# Patient Record
Sex: Male | Born: 1965 | Race: White | Hispanic: No | Marital: Married | Smoking: Former smoker
Health system: Southern US, Community
[De-identification: ages and names within clinical notes are randomized; demographics above are authoritative.]

## PROBLEM LIST (undated history)

## (undated) DIAGNOSIS — D126 Benign neoplasm of colon, unspecified: Secondary | ICD-10-CM

## (undated) DIAGNOSIS — R7989 Other specified abnormal findings of blood chemistry: Secondary | ICD-10-CM

## (undated) DIAGNOSIS — G4730 Sleep apnea, unspecified: Secondary | ICD-10-CM

## (undated) DIAGNOSIS — I10 Essential (primary) hypertension: Secondary | ICD-10-CM

## (undated) DIAGNOSIS — E785 Hyperlipidemia, unspecified: Secondary | ICD-10-CM

## (undated) DIAGNOSIS — I739 Peripheral vascular disease, unspecified: Secondary | ICD-10-CM

## (undated) HISTORY — PX: OTHER SURGICAL HISTORY: SHX169

## (undated) HISTORY — PX: APPENDECTOMY: SHX54

## (undated) HISTORY — PX: HERNIA REPAIR: SHX51

---

## 2013-02-10 ENCOUNTER — Ambulatory Visit: Payer: Self-pay | Admitting: Internal Medicine

## 2015-02-22 DIAGNOSIS — E786 Lipoprotein deficiency: Secondary | ICD-10-CM | POA: Insufficient documentation

## 2015-02-22 DIAGNOSIS — E781 Pure hyperglyceridemia: Secondary | ICD-10-CM | POA: Insufficient documentation

## 2015-09-05 DIAGNOSIS — Z720 Tobacco use: Secondary | ICD-10-CM | POA: Insufficient documentation

## 2015-11-30 ENCOUNTER — Encounter: Payer: Self-pay | Admitting: *Deleted

## 2015-12-01 ENCOUNTER — Ambulatory Visit
Admission: RE | Admit: 2015-12-01 | Discharge: 2015-12-01 | Disposition: A | Discharge status: Home / Self Care | Payer: BLUE CROSS/BLUE SHIELD | Source: Ambulatory Visit | Attending: Gastroenterology | Admitting: Gastroenterology

## 2015-12-01 ENCOUNTER — Ambulatory Visit: Payer: BLUE CROSS/BLUE SHIELD | Admitting: Anesthesiology

## 2015-12-01 ENCOUNTER — Encounter: Payer: Self-pay | Admitting: *Deleted

## 2015-12-01 ENCOUNTER — Encounter
Admission: RE | Disposition: A | Discharge status: Home / Self Care | Payer: Self-pay | Source: Ambulatory Visit | Attending: Gastroenterology

## 2015-12-01 DIAGNOSIS — D124 Benign neoplasm of descending colon: Secondary | ICD-10-CM | POA: Insufficient documentation

## 2015-12-01 DIAGNOSIS — Z6833 Body mass index (BMI) 33.0-33.9, adult: Secondary | ICD-10-CM | POA: Insufficient documentation

## 2015-12-01 DIAGNOSIS — E785 Hyperlipidemia, unspecified: Secondary | ICD-10-CM | POA: Diagnosis not present

## 2015-12-01 DIAGNOSIS — E669 Obesity, unspecified: Secondary | ICD-10-CM | POA: Insufficient documentation

## 2015-12-01 DIAGNOSIS — D120 Benign neoplasm of cecum: Secondary | ICD-10-CM | POA: Insufficient documentation

## 2015-12-01 DIAGNOSIS — Z8371 Family history of colonic polyps: Secondary | ICD-10-CM | POA: Diagnosis not present

## 2015-12-01 DIAGNOSIS — Z1211 Encounter for screening for malignant neoplasm of colon: Secondary | ICD-10-CM | POA: Insufficient documentation

## 2015-12-01 DIAGNOSIS — D123 Benign neoplasm of transverse colon: Secondary | ICD-10-CM | POA: Diagnosis not present

## 2015-12-01 DIAGNOSIS — E559 Vitamin D deficiency, unspecified: Secondary | ICD-10-CM | POA: Diagnosis not present

## 2015-12-01 DIAGNOSIS — I10 Essential (primary) hypertension: Secondary | ICD-10-CM | POA: Insufficient documentation

## 2015-12-01 DIAGNOSIS — K5790 Diverticulosis of intestine, part unspecified, without perforation or abscess without bleeding: Secondary | ICD-10-CM

## 2015-12-01 DIAGNOSIS — K5730 Diverticulosis of large intestine without perforation or abscess without bleeding: Secondary | ICD-10-CM | POA: Insufficient documentation

## 2015-12-01 DIAGNOSIS — Z87891 Personal history of nicotine dependence: Secondary | ICD-10-CM | POA: Insufficient documentation

## 2015-12-01 DIAGNOSIS — D122 Benign neoplasm of ascending colon: Secondary | ICD-10-CM | POA: Insufficient documentation

## 2015-12-01 DIAGNOSIS — Z79899 Other long term (current) drug therapy: Secondary | ICD-10-CM | POA: Insufficient documentation

## 2015-12-01 DIAGNOSIS — G4730 Sleep apnea, unspecified: Secondary | ICD-10-CM | POA: Insufficient documentation

## 2015-12-01 DIAGNOSIS — Z7982 Long term (current) use of aspirin: Secondary | ICD-10-CM | POA: Diagnosis not present

## 2015-12-01 HISTORY — DX: Other specified abnormal findings of blood chemistry: R79.89

## 2015-12-01 HISTORY — DX: Sleep apnea, unspecified: G47.30

## 2015-12-01 HISTORY — DX: Hyperlipidemia, unspecified: E78.5

## 2015-12-01 HISTORY — DX: Essential (primary) hypertension: I10

## 2015-12-01 HISTORY — PX: COLONOSCOPY WITH PROPOFOL: SHX5780

## 2015-12-01 HISTORY — DX: Diverticulosis of intestine, part unspecified, without perforation or abscess without bleeding: K57.90

## 2015-12-01 SURGERY — COLONOSCOPY WITH PROPOFOL
Anesthesia: General

## 2015-12-01 MED ORDER — MIDAZOLAM HCL 5 MG/5ML IJ SOLN
INTRAMUSCULAR | Status: DC | PRN
  Administered 2015-12-01: 1 mg via INTRAVENOUS

## 2015-12-01 MED ORDER — PROPOFOL 500 MG/50ML IV EMUL
INTRAVENOUS | Status: DC | PRN
  Administered 2015-12-01: 140 ug/kg/min via INTRAVENOUS

## 2015-12-01 MED ORDER — SODIUM CHLORIDE 0.9 % IV SOLN
INTRAVENOUS | Status: DC
Start: 2015-12-01 — End: 2015-12-01

## 2015-12-01 MED ORDER — SODIUM CHLORIDE 0.9 % IV SOLN
INTRAVENOUS | Status: DC
  Administered 2015-12-01: 1000 mL via INTRAVENOUS

## 2015-12-01 MED ORDER — FENTANYL CITRATE (PF) 100 MCG/2ML IJ SOLN
INTRAMUSCULAR | Status: DC | PRN
  Administered 2015-12-01: 50 ug via INTRAVENOUS

## 2015-12-01 MED ORDER — PROPOFOL 10 MG/ML IV BOLUS
INTRAVENOUS | Status: DC | PRN
  Administered 2015-12-01: 100 mg via INTRAVENOUS

## 2015-12-01 MED ORDER — LIDOCAINE 2% (20 MG/ML) 5 ML SYRINGE
INTRAMUSCULAR | Status: DC | PRN
  Administered 2015-12-01: 40 mg via INTRAVENOUS

## 2015-12-01 NOTE — Anesthesia Postprocedure Evaluation (Signed)
Anesthesia Post Note  Patient: Billy Herman  Procedure(s) Performed: Procedure(s) (LRB): COLONOSCOPY WITH PROPOFOL (N/A)  Patient location during evaluation: PACU Anesthesia Type: General Level of consciousness: awake Pain management: pain level controlled Vital Signs Assessment: post-procedure vital signs reviewed and stable Respiratory status: spontaneous breathing Cardiovascular status: stable Anesthetic complications: no    Last Vitals:  Vitals:   12/01/15 1159 12/01/15 1209  BP: 100/73 110/85  Pulse: 70 70  Resp: 16 11  Temp:      Last Pain:  Vitals:   12/01/15 1139  TempSrc: Tympanic                 VAN STAVEREN,[name]

## 2015-12-01 NOTE — Op Note (Signed)
[facility] Gastroenterology Patient Name: Billy Herman Procedure Date: 12/01/2015 10:38 AM MRN: [id number] Account #: [id number] Date of Birth: [birth year] Admit Type: Outpatient Age: 50 Room: [facility] ENDO ROOM 4 Gender: Male Note Status: Finalized Procedure:            Colonoscopy Indications:          Screening for colorectal malignant neoplasm Providers:            Lollie Sails, MD Referring MD:         [name], MD (Referring MD) Medicines:            Monitored Anesthesia Care Complications:        No immediate complications. Procedure:            Pre-Anesthesia Assessment:                       - ASA Grade Assessment: II - A patient with mild                        systemic disease.                       After obtaining informed consent, the colonoscope was                        passed under direct vision. Throughout the procedure,                        the patient's blood pressure, pulse, and oxygen                        saturations were monitored continuously. The Olympus                        PCF-H180AL colonoscope ( S#: [id number] ) was introduced                        through the anus and advanced to the the cecum,                        identified by appendiceal orifice and ileocecal valve.                        The colonoscopy was performed without difficulty. The                        patient tolerated the procedure well. The quality of                        the bowel preparation was fair. Findings:      Multiple small to medium diverticula were found in the sigmoid colon and       descending colon.      A 4 mm polyp was found in the descending colon. The polyp was flat. The       polyp was removed with a cold snare. Resection and retrieval were       complete.      A 3 mm polyp was found in the cecum. The polyp was flat. The polyp was       removed with a cold snare. Resection and retrieval were  complete.      A 4 mm polyp was  found in the proximal ascending colon. The polyp was       flat. The polyp was removed with a cold snare. Resection and retrieval       were complete.      A 3 mm polyp was found in the distal transverse colon. The polyp was       sessile. The polyp was removed with a cold biopsy forceps. Resection and       retrieval were complete.      The retroflexed view of the distal rectum and anal verge was normal and       showed no anal or rectal abnormalities.      The digital rectal exam was normal. Impression:           - Preparation of the colon was fair.                       - Diverticulosis in the sigmoid colon and in the                        descending colon.                       - One 4 mm polyp in the descending colon, removed with                        a cold snare. Resected and retrieved.                       - One 3 mm polyp in the cecum, removed with a cold                        snare. Resected and retrieved.                       - One 4 mm polyp in the proximal ascending colon,                        removed with a cold snare. Resected and retrieved.                       - One 3 mm polyp in the distal transverse colon,                        removed with a cold biopsy forceps. Resected and                        retrieved.                       - The distal rectum and anal verge are normal on                        retroflexion view. Recommendation:       - Discharge patient to home.                       - Telephone GI clinic for pathology results in 1 week. Procedure Code(s):    --- Professional ---  45385, Colonoscopy, flexible; with removal of tumor(s),                        polyp(s), or other lesion(s) by snare technique                       45380, 59, Colonoscopy, flexible; with biopsy, single                        or multiple Diagnosis Code(s):    --- Professional ---                       Z12.11, Encounter for screening for malignant  neoplasm                        of colon                       D12.4, Benign neoplasm of descending colon                       D12.0, Benign neoplasm of cecum                       D12.2, Benign neoplasm of ascending colon                       D12.3, Benign neoplasm of transverse colon (hepatic                        flexure or splenic flexure)                       K57.30, Diverticulosis of large intestine without                        perforation or abscess without bleeding CPT copyright 2016 American Medical Association. All rights reserved. The codes documented in this report are preliminary and upon coder review may  be revised to meet current compliance requirements. Lollie Sails, MD 12/01/2015 11:35:46 AM This report has been signed electronically. Number of Addenda: 0 Note Initiated On: 12/01/2015 10:38 AM Scope Withdrawal Time: 0 hours 15 minutes 26 seconds  Total Procedure Duration: 0 hours 30 minutes 40 seconds       [facility]

## 2015-12-01 NOTE — H&P (Signed)
Outpatient short stay form Pre-procedure 12/01/2015 10:53 AM Lollie Sails MD  Primary Physician: Dr [name]  Reason for visit:  Screening colonoscopy  History of present illness:  Patient is a 50 year old male presenting today as above. He has a family history of colon polyps in his mother. He did tolerate his prep well. He takes no blood thinning agents. He does take 81 mg aspirin daily which she has held for a couple days.    Current Facility-Administered Medications:  .  0.9 %  sodium chloride infusion, , Intravenous, Continuous, Lollie Sails, MD, Last Rate: 20 mL/hr at 12/01/15 0901, 1,000 mL at 12/01/15 0901 .  0.9 %  sodium chloride infusion, , Intravenous, Continuous, Lollie Sails, MD  Facility-Administered Medications Ordered in Other Encounters:  .  fentaNYL (SUBLIMAZE) injection, , Intravenous, Anesthesia Intra-op, Courtney Paris, CRNA, 50 mcg at 12/01/15 1044 .  midazolam (VERSED) 5 MG/5ML injection, , Intravenous, Anesthesia Intra-op, Courtney Paris, CRNA, 1 mg at 12/01/15 1044  Prescriptions Prior to Admission  Medication Sig Dispense Refill Last Dose  . aspirin EC 81 MG tablet Take 81 mg by mouth daily.     . ergocalciferol (VITAMIN D2) 50000 units capsule Take 50,000 Units by mouth once a week.     Marland Kitchen KRILL OIL PO Take by mouth.     . losartan-hydrochlorothiazide (HYZAAR) 50-12.5 MG tablet Take 1 tablet by mouth daily.   11/30/2015 at 2200     No Known Allergies   Past Medical History:  Diagnosis Date  . Hyperlipidemia   . Hypertension   . Low serum vitamin D   . Sleep apnea     Review of systems:      Physical Exam    Heart and lungs: Regular rate and rhythm without rub or gallop, lungs are bilaterally clear.    HEENT: Normocephalic atraumatic eyes are anicteric    Other:     Pertinant exam for procedure: Soft nontender nondistended bowel sounds positive normoactive.    Planned proceedures: Colonoscopy and indicated  procedures. I have discussed the risks benefits and complications of procedures to include not limited to bleeding, infection, perforation and the risk of sedation and the patient wishes to proceed.    Lollie Sails, MD Gastroenterology 12/01/2015  10:53 AM

## 2015-12-01 NOTE — Transfer of Care (Signed)
Immediate Anesthesia Transfer of Care Note  Patient: Billy Herman  Procedure(s) Performed: Procedure(s): COLONOSCOPY WITH PROPOFOL (N/A)  Patient Location: PACU  Anesthesia Type:General  Level of Consciousness: sedated  Airway & Oxygen Therapy: Patient Spontanous Breathing and Patient connected to nasal cannula oxygen  Post-op Assessment: Report given to RN and Post -op Vital signs reviewed and stable  Post vital signs: Reviewed and stable  Last Vitals:  Vitals:   12/01/15 0845 12/01/15 1139  BP: 139/80 98/68  Pulse: 74 63  Resp: 18 16  Temp: 36.2 C (!) 35.1 C    Last Pain:  Vitals:   12/01/15 1139  TempSrc: Tympanic         Complications: No apparent anesthesia complications

## 2015-12-01 NOTE — Anesthesia Preprocedure Evaluation (Signed)
Anesthesia Evaluation  Patient identified by MRN, date of birth, ID band Patient awake    Reviewed: Allergy & Precautions, NPO status , Patient's Chart, lab work & pertinent test results  Airway Mallampati: II       Dental  (+) Teeth Intact   Pulmonary sleep apnea and Continuous Positive Airway Pressure Ventilation , former smoker,     + decreased breath sounds      Cardiovascular Exercise Tolerance: Good hypertension, Pt. on medications  Rhythm:Regular     Neuro/Psych negative neurological ROS     GI/Hepatic negative GI ROS, Neg liver ROS,   Endo/Other  negative endocrine ROS  Renal/GU negative Renal ROS     Musculoskeletal negative musculoskeletal ROS (+)   Abdominal (+) + obese,   Peds  Hematology negative hematology ROS (+)   Anesthesia Other Findings   Reproductive/Obstetrics                             Anesthesia Physical Anesthesia Plan  ASA: II  Anesthesia Plan: General   Post-op Pain Management:    Induction: Intravenous  Airway Management Planned: Natural Airway and Nasal Cannula  Additional Equipment:   Intra-op Plan:   Post-operative Plan:   Informed Consent: I have reviewed the patients History and Physical, chart, labs and discussed the procedure including the risks, benefits and alternatives for the proposed anesthesia with the patient or authorized representative who has indicated his/her understanding and acceptance.     Plan Discussed with: CRNA  Anesthesia Plan Comments:         Anesthesia Quick Evaluation

## 2015-12-02 ENCOUNTER — Encounter: Payer: Self-pay | Admitting: Gastroenterology

## 2015-12-02 LAB — SURGICAL PATHOLOGY

## 2016-07-26 ENCOUNTER — Ambulatory Visit: Payer: Self-pay | Admitting: Medical

## 2016-07-26 ENCOUNTER — Encounter: Payer: Self-pay | Admitting: Medical

## 2016-07-26 VITALS — BP 120/80 | HR 82 | Temp 98.1°F | Resp 16 | Ht 73.0 in | Wt 263.0 lb

## 2016-07-26 DIAGNOSIS — R7989 Other specified abnormal findings of blood chemistry: Secondary | ICD-10-CM | POA: Insufficient documentation

## 2016-07-26 DIAGNOSIS — I10 Essential (primary) hypertension: Secondary | ICD-10-CM | POA: Insufficient documentation

## 2016-07-26 DIAGNOSIS — R05 Cough: Secondary | ICD-10-CM

## 2016-07-26 DIAGNOSIS — J019 Acute sinusitis, unspecified: Secondary | ICD-10-CM

## 2016-07-26 DIAGNOSIS — R059 Cough, unspecified: Secondary | ICD-10-CM

## 2016-07-26 DIAGNOSIS — B9789 Other viral agents as the cause of diseases classified elsewhere: Secondary | ICD-10-CM

## 2016-07-26 MED ORDER — BENZONATATE 100 MG PO CAPS: 200.0000 mg | ORAL_CAPSULE | Freq: Three times a day (TID) | ORAL | 0 refills | Status: DC | PRN

## 2016-07-26 NOTE — Progress Notes (Signed)
Patient reports sinus headache and pressure for 5 days.  Phlegm and mucus are clear.  Chronic cough is causing abdominal pain.  He's taking OTC Delsym, mucinex and Motrin sinus.  Subjective:    Patient ID: Billy Herman, male    DOB: 03/09/1966, 51 y.o.   MRN: [id number]  HPI 51 yo male with  Started Saturday , sinus drainage, scratchy throat, nasal congestion and runny nose.  No fever.  Headache top of head, after coughing a lot.  Soreness on the left mid abdomen from coughing so much per patient. Cough nonproductive and clear nasal discharge. History of scar at right side of mouth ran into a barb wire fence at  51 years old.   Review of Systems  Constitutional: Negative for chills, fatigue and fever.  HENT: Positive for congestion. Negative for ear pain, sinus pain, sinus pressure, sore throat and tinnitus.   Eyes: Negative for discharge and itching.  Respiratory: Positive for cough. Negative for shortness of breath and wheezing.   Cardiovascular: Negative for chest pain.  Gastrointestinal: Negative for diarrhea, nausea and vomiting.  Endocrine: Negative for polydipsia, polyphagia and polyuria.  Genitourinary: Negative for dysuria and hematuria.  Musculoskeletal: Negative for back pain.  Allergic/Immunologic: Positive for environmental allergies. Negative for food allergies.  Neurological: Negative for dizziness and syncope.  Hematological: Negative for adenopathy.  Psychiatric/Behavioral: Negative for confusion and hallucinations.       Objective:   Physical Exam  Constitutional: He is oriented to person, place, and time. He appears well-developed and well-nourished.  HENT:  Head: Normocephalic and atraumatic.  Right Ear: Hearing, tympanic membrane, external ear and ear canal normal.  Left Ear: Hearing, tympanic membrane, external ear and ear canal normal.  Nose: Rhinorrhea present.  Mouth/Throat: Uvula is midline, oropharynx is clear and moist and mucous membranes are normal.   Eyes: Conjunctivae and EOM are normal. Pupils are equal, round, and reactive to light.  Neck: Normal range of motion. Neck supple.  Cardiovascular: Normal rate and regular rhythm.  Exam reveals no gallop.   No murmur heard. Pulmonary/Chest: Effort normal and breath sounds normal.  Musculoskeletal: Normal range of motion.  Lymphadenopathy:    He has no cervical adenopathy.  Neurological: He is alert and oriented to person, place, and time.  Skin: Skin is warm and dry.  Psychiatric: He has a normal mood and affect. His behavior is normal.  Nursing note and vitals reviewed.         Assessment & Plan:  Benzonatate Perles 200mg  one three times a day for cough  #30 no refills  Otc Zyrtec 10 mg take as directed. OTC Flonase NS take as directed. OTC Motrin or Tylenol if not feeling well.  contact me if  Signs or symptoms of  Bacterial infection occur , Return to the clinic in 3-5 days if not improving. Reviewed with the patient . No questions at discharge, verbalizes understanding .

## 2016-07-26 NOTE — Patient Instructions (Signed)
Rest , increase fluids. Contact me if any chanages. Return to the clinic in  3-5 days if worsening or any concerns.

## 2016-08-31 ENCOUNTER — Other Ambulatory Visit: Payer: Self-pay

## 2016-08-31 DIAGNOSIS — E781 Pure hyperglyceridemia: Secondary | ICD-10-CM

## 2016-08-31 DIAGNOSIS — R7989 Other specified abnormal findings of blood chemistry: Secondary | ICD-10-CM

## 2016-08-31 DIAGNOSIS — Z720 Tobacco use: Secondary | ICD-10-CM

## 2016-08-31 DIAGNOSIS — I10 Essential (primary) hypertension: Secondary | ICD-10-CM

## 2016-09-01 LAB — CMP12+LP+TP+TSH+6AC+PSA+CBC…
A/G RATIO: 1.6 (ref 1.2–2.2)
ALK PHOS: 94 IU/L (ref 39–117)
ALT: 62 IU/L — ABNORMAL HIGH (ref 0–44)
AST: 45 IU/L — AB (ref 0–40)
Albumin: 4.6 g/dL (ref 3.5–5.5)
BASOS ABS: 0 10*3/uL (ref 0.0–0.2)
BILIRUBIN TOTAL: 0.4 mg/dL (ref 0.0–1.2)
BUN / CREAT RATIO: 16 (ref 9–20)
BUN: 15 mg/dL (ref 6–24)
Basos: 0 %
CHLORIDE: 101 mmol/L (ref 96–106)
CREATININE: 0.94 mg/dL (ref 0.76–1.27)
Calcium: 9.9 mg/dL (ref 8.7–10.2)
Chol/HDL Ratio: 5.8 ratio — ABNORMAL HIGH (ref 0.0–5.0)
Cholesterol, Total: 198 mg/dL (ref 100–199)
EOS (ABSOLUTE): 0.2 10*3/uL (ref 0.0–0.4)
EOS: 2 %
ESTIMATED CHD RISK: 1.2 times avg. — AB (ref 0.0–1.0)
Free Thyroxine Index: 1.5 (ref 1.2–4.9)
GFR, EST AFRICAN AMERICAN: 107 mL/min/{1.73_m2} (ref >59)
GFR, EST NON AFRICAN AMERICAN: 93 mL/min/{1.73_m2} (ref >59)
GGT: 81 IU/L — ABNORMAL HIGH (ref 0–65)
Globulin, Total: 2.8 g/dL (ref 1.5–4.5)
Glucose: 108 mg/dL — ABNORMAL HIGH (ref 65–99)
HDL: 34 mg/dL — AB (ref >39)
HEMOGLOBIN: 14.5 g/dL (ref 13.0–17.7)
Hematocrit: 42.4 % (ref 37.5–51.0)
IMMATURE GRANS (ABS): 0 10*3/uL (ref 0.0–0.1)
IRON: 64 ug/dL (ref 38–169)
Immature Granulocytes: 1 %
LDH: 189 IU/L (ref 121–224)
LDL CALC: 85 mg/dL (ref 0–99)
LYMPHS: 25 %
Lymphocytes Absolute: 1.8 10*3/uL (ref 0.7–3.1)
MCH: 31.7 pg (ref 26.6–33.0)
MCHC: 34.2 g/dL (ref 31.5–35.7)
MCV: 93 fL (ref 79–97)
MONOCYTES: 7 %
Monocytes Absolute: 0.5 10*3/uL (ref 0.1–0.9)
Neutrophils Absolute: 4.7 10*3/uL (ref 1.4–7.0)
Neutrophils: 65 %
PLATELETS: 311 10*3/uL (ref 150–379)
Phosphorus: 4.1 mg/dL (ref 2.5–4.5)
Potassium: 4.1 mmol/L (ref 3.5–5.2)
Prostate Specific Ag, Serum: 1.4 ng/mL (ref 0.0–4.0)
RBC: 4.58 x10E6/uL (ref 4.14–5.80)
RDW: 13.7 % (ref 12.3–15.4)
Sodium: 141 mmol/L (ref 134–144)
T3 Uptake Ratio: 21 % — ABNORMAL LOW (ref 24–39)
T4, Total: 7.2 ug/dL (ref 4.5–12.0)
TOTAL PROTEIN: 7.4 g/dL (ref 6.0–8.5)
TSH: 3.41 u[IU]/mL (ref 0.450–4.500)
Triglycerides: 395 mg/dL — ABNORMAL HIGH (ref 0–149)
URIC ACID: 6.1 mg/dL (ref 3.7–8.6)
VLDL CHOLESTEROL CAL: 79 mg/dL — AB (ref 5–40)
WBC: 7.2 10*3/uL (ref 3.4–10.8)

## 2016-09-01 LAB — URINALYSIS, ROUTINE W REFLEX MICROSCOPIC
Bilirubin, UA: NEGATIVE
Glucose, UA: NEGATIVE
Ketones, UA: NEGATIVE
Leukocytes, UA: NEGATIVE
NITRITE UA: NEGATIVE
PH UA: 5.5 (ref 5.0–7.5)
Protein, UA: NEGATIVE
RBC, UA: NEGATIVE
Specific Gravity, UA: 1.019 (ref 1.005–1.030)
Urobilinogen, Ur: 0.2 mg/dL (ref 0.2–1.0)

## 2016-09-01 LAB — MAGNESIUM: MAGNESIUM: 2.2 mg/dL (ref 1.6–2.3)

## 2016-09-01 LAB — B12 AND FOLATE PANEL
Folate: 8.2 ng/mL (ref >3.0)
Vitamin B-12: 1150 pg/mL (ref 232–1245)

## 2016-09-01 LAB — VITAMIN D 25 HYDROXY (VIT D DEFICIENCY, FRACTURES): VIT D 25 HYDROXY: 35.3 ng/mL (ref 30.0–100.0)

## 2016-09-01 LAB — HEMOGLOBIN A1C
ESTIMATED AVERAGE GLUCOSE: 126 mg/dL
HEMOGLOBIN A1C: 6 % — AB (ref 4.8–5.6)

## 2016-09-19 DIAGNOSIS — G4733 Obstructive sleep apnea (adult) (pediatric): Secondary | ICD-10-CM | POA: Insufficient documentation

## 2016-09-19 DIAGNOSIS — R0602 Shortness of breath: Secondary | ICD-10-CM | POA: Insufficient documentation

## 2016-10-22 DIAGNOSIS — I77810 Thoracic aortic ectasia: Secondary | ICD-10-CM | POA: Insufficient documentation

## 2016-10-22 DIAGNOSIS — I351 Nonrheumatic aortic (valve) insufficiency: Secondary | ICD-10-CM | POA: Insufficient documentation

## 2017-01-17 ENCOUNTER — Ambulatory Visit: Payer: Self-pay | Admitting: Adult Health

## 2017-01-17 ENCOUNTER — Encounter: Payer: Self-pay | Admitting: Adult Health

## 2017-01-17 VITALS — BP 120/80 | HR 78 | Temp 97.5°F | Resp 16 | Wt 262.0 lb

## 2017-01-17 DIAGNOSIS — H6502 Acute serous otitis media, left ear: Secondary | ICD-10-CM

## 2017-01-17 MED ORDER — AMOXICILLIN-POT CLAVULANATE 875-125 MG PO TABS: 1.0000 | ORAL_TABLET | Freq: Two times a day (BID) | ORAL | 0 refills | Status: DC

## 2017-01-17 NOTE — Progress Notes (Addendum)
Subjective:     Patient ID: Billy Herman, male   DOB: Dec 10, 1965, 51 y.o.   MRN: [id number]  HPI  Patient is a 51 year old male in no acute distress who presents to the clinic with nasal congestion, chest congestion. Non productive- reports cough is mild. He has been taking Delsym at night.  Reports pressure in his sinuses worse with bending over. Rhinorrhea present.  He denies any recent infections or recent antibiotics.   He denies any pain.     [name], MD is his PCP and he report she sees him regularly.    He wears a CPAP at night.  Denies any recent infections or recent antibiotics.  Patient  denies any fever, chills, rash, chest pain, shortness of breath, nausea, vomiting, or diarrhea.Denies any edema.   He uses smokeless tobacco.  No Known Allergies Patient Active Problem List   Diagnosis Date Noted  . Ascending aorta dilation (HCC) 10/22/2016  . Moderate aortic valve insufficiency 10/22/2016  . OSA (obstructive sleep apnea) 09/19/2016  . SOBOE (shortness of breath on exertion) 09/19/2016  . Hypertension 07/26/2016  . Low serum vitamin D 07/26/2016  . Tobacco user 09/05/2015  . Hypertriglyceridemia 02/22/2015  . Low HDL (under 40) 02/22/2015     Current Outpatient Medications:  .  aspirin EC 81 MG tablet, Take 81 mg by mouth daily., Disp: , Rfl:  .  ergocalciferol (VITAMIN D2) 50000 units capsule, Take 50,000 Units by mouth once a week., Disp: , Rfl:  .  losartan-hydrochlorothiazide (HYZAAR) 50-12.5 MG tablet, Take 1 tablet by mouth daily., Disp: , Rfl:     Review of Systems  Constitutional: Negative for activity change, appetite change, chills, diaphoresis, fatigue, fever and unexpected weight change.  HENT: Positive for congestion, ear pain, postnasal drip and rhinorrhea. Negative for dental problem, drooling, ear discharge, facial swelling, hearing loss, mouth sores, nosebleeds, sinus pressure, sinus pain, sneezing, sore throat, tinnitus and trouble  swallowing.   Respiratory: Negative.  Negative for apnea, cough, choking, chest tightness, shortness of breath, wheezing and stridor.   Cardiovascular: Negative for chest pain, palpitations and leg swelling.  Gastrointestinal: Negative.   Genitourinary: Negative.   Musculoskeletal: Negative.   Skin: Negative.   Allergic/Immunologic: Negative.   Neurological: Negative.   Hematological: Negative.   Psychiatric/Behavioral: Negative.        Objective:   Physical Exam  Constitutional: He is oriented to person, place, and time. Vital signs are normal. He appears well-developed and well-nourished. He is active.  Non-toxic appearance. He does not have a sickly appearance. He does not appear ill. No distress.  HENT:  Head: Normocephalic and atraumatic.  Right Ear: Hearing, tympanic membrane, external ear and ear canal normal.  Left Ear: No drainage or swelling. No mastoid tenderness. Tympanic membrane is erythematous. Tympanic membrane is not perforated.  Nose: Mucosal edema and rhinorrhea present. Right sinus exhibits maxillary sinus tenderness. Right sinus exhibits no frontal sinus tenderness. Left sinus exhibits maxillary sinus tenderness. Left sinus exhibits no frontal sinus tenderness.  Mouth/Throat: Uvula is midline, oropharynx is clear and moist and mucous membranes are normal.  Eyes: Conjunctivae and EOM are normal. Pupils are equal, round, and reactive to light. Right eye exhibits no discharge. Left eye exhibits no discharge. No scleral icterus.  Neck: Normal range of motion. Neck supple. No JVD present. No tracheal deviation present. No thyromegaly present.  Cardiovascular: Normal rate, regular rhythm, normal heart sounds and intact distal pulses. Exam reveals no gallop and no friction rub.  No murmur heard. Pulmonary/Chest: Breath sounds normal. No stridor. No respiratory distress. He has no wheezes. He has no rales. He exhibits no tenderness.  Abdominal: Soft. Bowel sounds are normal.  He exhibits no distension and no mass. There is no tenderness. There is no rebound and no guarding.  Musculoskeletal: Normal range of motion.  Lymphadenopathy:    He has no cervical adenopathy.  Neurological: He is alert and oriented to person, place, and time. He has normal strength and normal reflexes. He displays normal reflexes. No cranial nerve deficit or sensory deficit. He exhibits normal muscle tone. He displays a negative Romberg sign. Coordination normal.  Patient moves on and off of exam table and in room without difficulty. Gait is normal in hall and in room. Patient is oriented to person place time and situation. Patient answers questions appropriately and engages in conversation.   Skin: Skin is warm and dry. No rash noted. He is not diaphoretic. No erythema. No pallor.  Psychiatric: He has a normal mood and affect. His speech is normal and behavior is normal. Judgment and thought content normal. Cognition and memory are normal.  Nursing note and vitals reviewed.      Assessment:     Acute serous otitis media of left ear, recurrence not specified  Sinusitis     Plan:     Meds ordered this encounter  Medications  . amoxicillin-clavulanate (AUGMENTIN) 875-125 MG tablet    Sig: Take 1 tablet 2 (two) times daily by mouth.    Dispense:  20 tablet    Refill:  0   Advised to return to clinic for an appointment if no improvement within 72 hours or if any symptoms change or worsen. Advised ER or urgent Care if after hours or on weekend. 911 for emergency symptoms at any time.  Follow up with [name], MD if symptoms worsen or persist.  Patient verbalized understanding of all instructions given and denies any further questions at this time.

## 2017-01-17 NOTE — Patient Instructions (Signed)
Sinusitis, Adult Sinusitis is soreness and inflammation of your sinuses. Sinuses are hollow spaces in the bones around your face. They are located:  Around your eyes.  In the middle of your forehead.  Behind your nose.  In your cheekbones.  Your sinuses and nasal passages are lined with a stringy fluid (mucus). Mucus normally drains out of your sinuses. When your nasal tissues get inflamed or swollen, the mucus can get trapped or blocked so air cannot flow through your sinuses. This lets bacteria, viruses, and funguses grow, and that leads to infection. Follow these instructions at home: Medicines  Take, use, or apply over-the-counter and prescription medicines only as told by your doctor. These may include nasal sprays.  If you were prescribed an antibiotic medicine, take it as told by your doctor. Do not stop taking the antibiotic even if you start to feel better. Hydrate and Humidify  Drink enough water to keep your pee (urine) clear or pale yellow.  Use a cool mist humidifier to keep the humidity level in your home above 50%.  Breathe in steam for 10-15 minutes, 3-4 times a day or as told by your doctor. You can do this in the bathroom while a hot shower is running.  Try not to spend time in cool or dry air. Rest  Rest as much as possible.  Sleep with your head raised (elevated).  Make sure to get enough sleep each night. General instructions  Put a warm, moist washcloth on your face 3-4 times a day or as told by your doctor. This will help with discomfort.  Wash your hands often with soap and water. If there is no soap and water, use hand sanitizer.  Do not smoke. Avoid being around people who are smoking (secondhand smoke).  Keep all follow-up visits as told by your doctor. This is important. Contact a doctor if:  You have a fever.  Your symptoms get worse.  Your symptoms do not get better within 10 days. Get help right away if:  You have a very bad  headache.  You cannot stop throwing up (vomiting).  You have pain or swelling around your face or eyes.  You have trouble seeing.  You feel confused.  Your neck is stiff.  You have trouble breathing. This information is not intended to replace advice given to you by your health care provider. Make sure you discuss any questions you have with your health care provider. Document Released: 08/15/2007 Document Revised: 10/23/2015 Document Reviewed: 12/22/2014 Elsevier Interactive Patient Education  2018 [employer]. Otitis Media, Adult Otitis media is redness, soreness, and puffiness (swelling) in the space just behind your eardrum (middle ear). It may be caused by allergies or infection. It often happens along with a cold. Follow these instructions at home:  Take your medicine as told. Finish it even if you start to feel better.  Only take over-the-counter or prescription medicines for pain, discomfort, or fever as told by your doctor.  Follow up with your doctor as told. Contact a doctor if:  You have otitis media only in one ear, or bleeding from your nose, or both.  You notice a lump on your neck.  You are not getting better in 3-5 days.  You feel worse instead of better. Get help right away if:  You have pain that is not helped with medicine.  You have puffiness, redness, or pain around your ear.  You get a stiff neck.  You cannot move part of your face (  paralysis).  You notice that the bone behind your ear hurts when you touch it. This information is not intended to replace advice given to you by your health care provider. Make sure you discuss any questions you have with your health care provider. Document Released: 08/15/2007 Document Revised: 08/04/2015 Document Reviewed: 09/23/2012 Elsevier Interactive Patient Education  2017 [employer]  

## 2017-02-27 ENCOUNTER — Other Ambulatory Visit: Payer: Self-pay

## 2017-02-27 DIAGNOSIS — Z0000 Encounter for general adult medical examination without abnormal findings: Secondary | ICD-10-CM

## 2017-02-28 LAB — CMP12+LP+TP+TSH+6AC+PSA+CBC…
ALK PHOS: 65 IU/L (ref 39–117)
ALT: 33 IU/L (ref 0–44)
AST: 28 IU/L (ref 0–40)
Albumin/Globulin Ratio: 2 (ref 1.2–2.2)
Albumin: 4.8 g/dL (ref 3.5–5.5)
BASOS ABS: 0 10*3/uL (ref 0.0–0.2)
BILIRUBIN TOTAL: 0.4 mg/dL (ref 0.0–1.2)
BUN / CREAT RATIO: 16 (ref 9–20)
BUN: 18 mg/dL (ref 6–24)
Basos: 0 %
CHLORIDE: 105 mmol/L (ref 96–106)
CREATININE: 1.13 mg/dL (ref 0.76–1.27)
Calcium: 10 mg/dL (ref 8.7–10.2)
Chol/HDL Ratio: 4.8 ratio (ref 0.0–5.0)
Cholesterol, Total: 183 mg/dL (ref 100–199)
EOS (ABSOLUTE): 0.2 10*3/uL (ref 0.0–0.4)
EOS: 3 %
ESTIMATED CHD RISK: 1 times avg. (ref 0.0–1.0)
Free Thyroxine Index: 1.6 (ref 1.2–4.9)
GFR, EST AFRICAN AMERICAN: 86 mL/min/{1.73_m2} (ref >59)
GFR, EST NON AFRICAN AMERICAN: 74 mL/min/{1.73_m2} (ref >59)
GGT: 52 IU/L (ref 0–65)
GLUCOSE: 108 mg/dL — AB (ref 65–99)
Globulin, Total: 2.4 g/dL (ref 1.5–4.5)
HDL: 38 mg/dL — ABNORMAL LOW (ref >39)
HEMOGLOBIN: 13.7 g/dL (ref 13.0–17.7)
Hematocrit: 40.6 % (ref 37.5–51.0)
IMMATURE GRANS (ABS): 0.1 10*3/uL (ref 0.0–0.1)
Immature Granulocytes: 1 %
Iron: 103 ug/dL (ref 38–169)
LDH: 170 IU/L (ref 121–224)
LDL CALC: 114 mg/dL — AB (ref 0–99)
LYMPHS: 23 %
Lymphocytes Absolute: 1.8 10*3/uL (ref 0.7–3.1)
MCH: 31.4 pg (ref 26.6–33.0)
MCHC: 33.7 g/dL (ref 31.5–35.7)
MCV: 93 fL (ref 79–97)
MONOCYTES: 10 %
Monocytes Absolute: 0.7 10*3/uL (ref 0.1–0.9)
NEUTROS ABS: 4.9 10*3/uL (ref 1.4–7.0)
Neutrophils: 63 %
PLATELETS: 420 10*3/uL — AB (ref 150–379)
Phosphorus: 4.1 mg/dL (ref 2.5–4.5)
Potassium: 4.2 mmol/L (ref 3.5–5.2)
Prostate Specific Ag, Serum: 1.6 ng/mL (ref 0.0–4.0)
RBC: 4.37 x10E6/uL (ref 4.14–5.80)
RDW: 13 % (ref 12.3–15.4)
Sodium: 142 mmol/L (ref 134–144)
T3 Uptake Ratio: 22 % — ABNORMAL LOW (ref 24–39)
T4, Total: 7.2 ug/dL (ref 4.5–12.0)
TOTAL PROTEIN: 7.2 g/dL (ref 6.0–8.5)
TSH: 4.05 u[IU]/mL (ref 0.450–4.500)
Triglycerides: 155 mg/dL — ABNORMAL HIGH (ref 0–149)
URIC ACID: 4.6 mg/dL (ref 3.7–8.6)
VLDL CHOLESTEROL CAL: 31 mg/dL (ref 5–40)
WBC: 7.7 10*3/uL (ref 3.4–10.8)

## 2017-02-28 LAB — URINALYSIS, ROUTINE W REFLEX MICROSCOPIC
BILIRUBIN UA: NEGATIVE
Glucose, UA: NEGATIVE
Ketones, UA: NEGATIVE
Leukocytes, UA: NEGATIVE
Nitrite, UA: NEGATIVE
PH UA: 5 (ref 5.0–7.5)
PROTEIN UA: NEGATIVE
RBC UA: NEGATIVE
Specific Gravity, UA: 1.022 (ref 1.005–1.030)
UUROB: 0.2 mg/dL (ref 0.2–1.0)

## 2017-02-28 LAB — B12 AND FOLATE PANEL
FOLATE: 11.2 ng/mL (ref >3.0)
VITAMIN B 12: 822 pg/mL (ref 232–1245)

## 2017-02-28 LAB — HGB A1C W/O EAG: HEMOGLOBIN A1C: 6 % — AB (ref 4.8–5.6)

## 2017-02-28 LAB — VITAMIN D 25 HYDROXY (VIT D DEFICIENCY, FRACTURES): VIT D 25 HYDROXY: 39.5 ng/mL (ref 30.0–100.0)

## 2017-02-28 LAB — MAGNESIUM: Magnesium: 2.1 mg/dL (ref 1.6–2.3)

## 2017-03-06 DIAGNOSIS — N401 Enlarged prostate with lower urinary tract symptoms: Secondary | ICD-10-CM | POA: Insufficient documentation

## 2017-03-06 DIAGNOSIS — R3912 Poor urinary stream: Secondary | ICD-10-CM

## 2017-08-27 ENCOUNTER — Other Ambulatory Visit: Payer: Self-pay

## 2017-08-27 DIAGNOSIS — E785 Hyperlipidemia, unspecified: Secondary | ICD-10-CM

## 2017-08-27 DIAGNOSIS — N138 Other obstructive and reflux uropathy: Secondary | ICD-10-CM

## 2017-08-27 DIAGNOSIS — N401 Enlarged prostate with lower urinary tract symptoms: Secondary | ICD-10-CM

## 2017-08-27 DIAGNOSIS — I10 Essential (primary) hypertension: Secondary | ICD-10-CM

## 2017-08-28 ENCOUNTER — Other Ambulatory Visit: Payer: Self-pay

## 2017-08-28 DIAGNOSIS — N138 Other obstructive and reflux uropathy: Secondary | ICD-10-CM

## 2017-08-28 DIAGNOSIS — I10 Essential (primary) hypertension: Secondary | ICD-10-CM

## 2017-08-28 DIAGNOSIS — N401 Enlarged prostate with lower urinary tract symptoms: Secondary | ICD-10-CM

## 2017-08-28 DIAGNOSIS — E785 Hyperlipidemia, unspecified: Secondary | ICD-10-CM

## 2017-08-29 LAB — URINALYSIS, ROUTINE W REFLEX MICROSCOPIC
BILIRUBIN UA: NEGATIVE
GLUCOSE, UA: NEGATIVE
KETONES UA: NEGATIVE
Leukocytes, UA: NEGATIVE
NITRITE UA: NEGATIVE
Protein, UA: NEGATIVE
RBC UA: NEGATIVE
SPEC GRAV UA: 1.02 (ref 1.005–1.030)
Urobilinogen, Ur: 0.2 mg/dL (ref 0.2–1.0)
pH, UA: 5 (ref 5.0–7.5)

## 2017-08-29 LAB — COMPREHENSIVE METABOLIC PANEL
ALT: 35 IU/L (ref 0–44)
AST: 30 IU/L (ref 0–40)
Albumin/Globulin Ratio: 1.7 (ref 1.2–2.2)
Albumin: 4.7 g/dL (ref 3.5–5.5)
Alkaline Phosphatase: 58 IU/L (ref 39–117)
BUN/Creatinine Ratio: 17 (ref 9–20)
BUN: 20 mg/dL (ref 6–24)
Bilirubin Total: 0.5 mg/dL (ref 0.0–1.2)
CALCIUM: 9.7 mg/dL (ref 8.7–10.2)
CO2: 19 mmol/L — AB (ref 20–29)
CREATININE: 1.19 mg/dL (ref 0.76–1.27)
Chloride: 108 mmol/L — ABNORMAL HIGH (ref 96–106)
GFR calc Af Amer: 80 mL/min/{1.73_m2} (ref >59)
GFR, EST NON AFRICAN AMERICAN: 69 mL/min/{1.73_m2} (ref >59)
GLOBULIN, TOTAL: 2.7 g/dL (ref 1.5–4.5)
Glucose: 108 mg/dL — ABNORMAL HIGH (ref 65–99)
Potassium: 4 mmol/L (ref 3.5–5.2)
Sodium: 144 mmol/L (ref 134–144)
Total Protein: 7.4 g/dL (ref 6.0–8.5)

## 2017-08-29 LAB — CBC WITH DIFFERENTIAL/PLATELET
Basophils Absolute: 0 10*3/uL (ref 0.0–0.2)
Basos: 0 %
EOS (ABSOLUTE): 0.2 10*3/uL (ref 0.0–0.4)
EOS: 3 %
HEMATOCRIT: 38.5 % (ref 37.5–51.0)
HEMOGLOBIN: 13 g/dL (ref 13.0–17.7)
IMMATURE GRANS (ABS): 0 10*3/uL (ref 0.0–0.1)
IMMATURE GRANULOCYTES: 1 %
LYMPHS: 24 %
Lymphocytes Absolute: 1.6 10*3/uL (ref 0.7–3.1)
MCH: 30.7 pg (ref 26.6–33.0)
MCHC: 33.8 g/dL (ref 31.5–35.7)
MCV: 91 fL (ref 79–97)
MONOCYTES: 7 %
MONOS ABS: 0.5 10*3/uL (ref 0.1–0.9)
NEUTROS PCT: 65 %
Neutrophils Absolute: 4.5 10*3/uL (ref 1.4–7.0)
Platelets: 404 10*3/uL (ref 150–450)
RBC: 4.24 x10E6/uL (ref 4.14–5.80)
RDW: 13.3 % (ref 12.3–15.4)
WBC: 6.9 10*3/uL (ref 3.4–10.8)

## 2017-08-29 LAB — LIPID PANEL
CHOLESTEROL TOTAL: 191 mg/dL (ref 100–199)
Chol/HDL Ratio: 5.5 ratio — ABNORMAL HIGH (ref 0.0–5.0)
HDL: 35 mg/dL — ABNORMAL LOW (ref >39)
LDL Calculated: 125 mg/dL — ABNORMAL HIGH (ref 0–99)
TRIGLYCERIDES: 155 mg/dL — AB (ref 0–149)
VLDL Cholesterol Cal: 31 mg/dL (ref 5–40)

## 2017-08-29 LAB — HGB A1C W/O EAG: Hgb A1c MFr Bld: 6.1 % — ABNORMAL HIGH (ref 4.8–5.6)

## 2017-08-29 LAB — B12 AND FOLATE PANEL
FOLATE: 13.8 ng/mL (ref >3.0)
Vitamin B-12: 519 pg/mL (ref 232–1245)

## 2017-08-29 LAB — VITAMIN D 25 HYDROXY (VIT D DEFICIENCY, FRACTURES): Vit D, 25-Hydroxy: 39.1 ng/mL (ref 30.0–100.0)

## 2017-08-29 LAB — TSH+FREE T4
FREE T4: 1.14 ng/dL (ref 0.82–1.77)
TSH: 3.08 u[IU]/mL (ref 0.450–4.500)

## 2017-08-29 LAB — PSA: Prostate Specific Ag, Serum: 1.5 ng/mL (ref 0.0–4.0)

## 2017-08-29 LAB — MAGNESIUM: Magnesium: 2.2 mg/dL (ref 1.6–2.3)

## 2017-09-24 ENCOUNTER — Other Ambulatory Visit: Payer: Self-pay | Admitting: Nurse Practitioner

## 2017-09-24 DIAGNOSIS — I77810 Thoracic aortic ectasia: Secondary | ICD-10-CM

## 2017-09-26 ENCOUNTER — Other Ambulatory Visit: Payer: Self-pay | Admitting: Nurse Practitioner

## 2017-09-26 DIAGNOSIS — R0602 Shortness of breath: Secondary | ICD-10-CM

## 2017-09-26 DIAGNOSIS — I77810 Thoracic aortic ectasia: Secondary | ICD-10-CM

## 2017-10-01 ENCOUNTER — Ambulatory Visit: Payer: BLUE CROSS/BLUE SHIELD

## 2017-10-08 ENCOUNTER — Ambulatory Visit
Admission: RE | Admit: 2017-10-08 | Discharge: 2017-10-08 | Disposition: A | Discharge status: Home / Self Care | Payer: BLUE CROSS/BLUE SHIELD | Source: Ambulatory Visit | Attending: Nurse Practitioner | Admitting: Nurse Practitioner

## 2017-10-08 DIAGNOSIS — J439 Emphysema, unspecified: Secondary | ICD-10-CM | POA: Diagnosis not present

## 2017-10-08 DIAGNOSIS — I351 Nonrheumatic aortic (valve) insufficiency: Secondary | ICD-10-CM | POA: Insufficient documentation

## 2017-10-08 DIAGNOSIS — I700 Atherosclerosis of aorta: Secondary | ICD-10-CM | POA: Diagnosis not present

## 2017-10-08 DIAGNOSIS — I712 Thoracic aortic aneurysm, without rupture: Secondary | ICD-10-CM | POA: Diagnosis not present

## 2017-10-08 DIAGNOSIS — R0602 Shortness of breath: Secondary | ICD-10-CM

## 2017-10-08 DIAGNOSIS — I77810 Thoracic aortic ectasia: Secondary | ICD-10-CM | POA: Diagnosis present

## 2017-10-08 MED ORDER — IOPAMIDOL (ISOVUE-370) INJECTION 76%
75.0000 mL | Freq: Once | INTRAVENOUS | Status: AC | PRN
  Administered 2017-10-08: 75 mL via INTRAVENOUS

## 2018-01-30 ENCOUNTER — Ambulatory Visit: Payer: Self-pay | Admitting: Adult Health

## 2018-01-30 ENCOUNTER — Encounter: Payer: Self-pay | Admitting: Adult Health

## 2018-01-30 VITALS — BP 132/79 | HR 72 | Temp 98.2°F | Resp 16 | Ht 72.0 in | Wt 260.0 lb

## 2018-01-30 DIAGNOSIS — J029 Acute pharyngitis, unspecified: Secondary | ICD-10-CM

## 2018-01-30 DIAGNOSIS — H60502 Unspecified acute noninfective otitis externa, left ear: Secondary | ICD-10-CM

## 2018-01-30 DIAGNOSIS — H6502 Acute serous otitis media, left ear: Secondary | ICD-10-CM

## 2018-01-30 MED ORDER — AMOXICILLIN-POT CLAVULANATE 875-125 MG PO TABS: 1.0000 | ORAL_TABLET | Freq: Two times a day (BID) | ORAL | 0 refills | Status: DC

## 2018-01-30 MED ORDER — NEOMYCIN-POLYMYXIN-HC 3.5-10000-1 OT SOLN: 3.0000 [drp] | Freq: Four times a day (QID) | OTIC | 0 refills | Status: DC

## 2018-01-30 NOTE — Patient Instructions (Signed)
Pharyngitis Pharyngitis is a sore throat (pharynx). There is redness, pain, and swelling of your throat. Follow these instructions at home:  Drink enough fluids to keep your pee (urine) clear or pale yellow.  Only take medicine as told by your doctor. ? You may get sick again if you do not take medicine as told. Finish your medicines, even if you start to feel better. ? Do not take aspirin.  Rest.  Rinse your mouth (gargle) with salt water ( tsp of salt per 1 qt of water) every 1-2 hours. This will help the pain.  If you are not at risk for choking, you can suck on hard candy or sore throat lozenges. Contact a doctor if:  You have large, tender lumps on your neck.  You have a rash.  You cough up green, yellow-brown, or bloody spit. Get help right away if:  You have a stiff neck.  You drool or cannot swallow liquids.  You throw up (vomit) or are not able to keep medicine or liquids down.  You have very bad pain that does not go away with medicine.  You have problems breathing (not from a stuffy nose). This information is not intended to replace advice given to you by your health care provider. Make sure you discuss any questions you have with your health care provider. Document Released: 08/15/2007 Document Revised: 08/04/2015 Document Reviewed: 11/03/2012 Elsevier Interactive Patient Education  2017 [employer]. Otitis Externa Otitis externa is an infection of the outer ear canal. The outer ear canal is the area between the outside of the ear and the eardrum. Otitis externa is sometimes called "swimmer's ear." Follow these instructions at home:  If you were given antibiotic ear drops, use them as told by your doctor. Do not stop using them even if your condition gets better.  Take over-the-counter and prescription medicines only as told by your doctor.  Keep all follow-up visits as told by your doctor. This is important. How is this prevented?  Keep your ear dry. Use  the corner of a towel to dry your ear after you swim or bathe.  Try not to scratch or put things in your ear. Doing these things makes it easier for germs to grow in your ear.  Avoid swimming in lakes, dirty water, or pools that may not have the right amount of a chemical called chlorine.  Consider making ear drops and putting 3 or 4 drops in each ear after you swim. Ask your doctor about how you can make ear drops. Contact a doctor if:  You have a fever.  After 3 days your ear is still red, swollen, or painful.  After 3 days you still have pus coming from your ear.  Your redness, swelling, or pain gets worse.  You have a really bad headache.  You have redness, swelling, pain, or tenderness behind your ear. This information is not intended to replace advice given to you by your health care provider. Make sure you discuss any questions you have with your health care provider. Document Released: 08/15/2007 Document Revised: 03/24/2015 Document Reviewed: 12/06/2014 Elsevier Interactive Patient Education  2018 [employer]. Otitis Media, Adult Otitis media is redness, soreness, and puffiness (swelling) in the space just behind your eardrum (middle ear). It may be caused by allergies or infection. It often happens along with a cold. Follow these instructions at home:  Take your medicine as told. Finish it even if you start to feel better.  Only take over-the-counter or  prescription medicines for pain, discomfort, or fever as told by your doctor.  Follow up with your doctor as told. Contact a doctor if:  You have otitis media only in one ear, or bleeding from your nose, or both.  You notice a lump on your neck.  You are not getting better in 3-5 days.  You feel worse instead of better. Get help right away if:  You have pain that is not helped with medicine.  You have puffiness, redness, or pain around your ear.  You get a stiff neck.  You cannot move part of your face  (paralysis).  You notice that the bone behind your ear hurts when you touch it. This information is not intended to replace advice given to you by your health care provider. Make sure you discuss any questions you have with your health care provider. Document Released: 08/15/2007 Document Revised: 08/04/2015 Document Reviewed: 09/23/2012 Elsevier Interactive Patient Education  2017 [employer].

## 2018-01-30 NOTE — Progress Notes (Signed)
Subjective:     Patient ID: Billy Herman, male   DOB: [birth year], 52 y.o.   MRN: [id number]  HPI  Blood pressure 132/79, pulse 72, temperature 98.2 F (36.8 C), resp. rate 16, height 6' (1.829 m), weight 260 lb (117.9 kg), SpO2 98 %.  Patient is a 52 year old male in no acute distress left ear pain, sore throat x 8 days.  Wife has been sick x 2 weeks and is now on antibiotics from her primary care.   Patient  denies any fever, body aches,chills, rash, chest pain, shortness of breath, nausea, vomiting, or diarrhea.  Denies abdominal pain and denies edema.   He has been followed by and is followed by Dr. Drema [place] cardiology for aortic root dilation. Denies any change in any symptoms. He has regular follow ups with primary care as well he reports [name], MD.     Review of Systems  Constitutional: Negative.   HENT: Positive for ear pain and sore throat. Negative for congestion, dental problem, drooling, ear discharge, facial swelling, hearing loss, mouth sores, nosebleeds, postnasal drip, rhinorrhea, sinus pressure, sinus pain, sneezing, tinnitus, trouble swallowing and voice change.   Eyes: Negative.   Respiratory: Negative.   Cardiovascular: Negative.   Genitourinary: Negative.   Musculoskeletal: Negative.   Skin: Negative.   Neurological: Negative.   Hematological: Negative.   Psychiatric/Behavioral: Negative.        Objective:   Physical Exam  Constitutional: He is oriented to person, place, and time. Vital signs are normal. He appears well-developed and well-nourished. He is active.  Non-toxic appearance. He does not have a sickly appearance. He does not appear ill. No distress.  Patient is alert and oriented and responsive to questions Engages in eye contact with provider. Speaks in full sentences without any pauses without any shortness of breath or distress.    HENT:  Head: Normocephalic and atraumatic.  Right Ear: Hearing, tympanic membrane and ear canal  normal. No drainage, swelling or tenderness. Tympanic membrane is not injected, not perforated and not erythematous. No decreased hearing is noted.  Left Ear: Hearing normal. There is swelling and tenderness. No drainage. Tympanic membrane is erythematous. Tympanic membrane is not injected. No decreased hearing is noted.  Nose: Rhinorrhea present. No mucosal edema. Right sinus exhibits no maxillary sinus tenderness and no frontal sinus tenderness. Left sinus exhibits no maxillary sinus tenderness and no frontal sinus tenderness.  Mouth/Throat: Uvula is midline and mucous membranes are normal. No oral lesions. No uvula swelling. Oropharyngeal exudate and posterior oropharyngeal erythema present. No posterior oropharyngeal edema or tonsillar abscesses. Tonsils are 1+ on the right. Tonsils are 1+ on the left. Tonsillar exudate.    Eyes: Pupils are equal, round, and reactive to light. Conjunctivae and EOM are normal. Right eye exhibits no discharge. Left eye exhibits no discharge. No scleral icterus.  Neck: Trachea normal, normal range of motion, full passive range of motion without pain and phonation normal. Neck supple. No Brudzinski's sign noted. No thyromegaly present.  Cardiovascular: Normal rate, regular rhythm, normal heart sounds and intact distal pulses. Exam reveals no gallop and no friction rub.  No murmur heard. Pulmonary/Chest: Effort normal and breath sounds normal. No stridor. No apnea. No respiratory distress. He has no wheezes. He has no rales. He exhibits no tenderness.  Abdominal: Soft. Normal appearance and normal aorta. He exhibits no pulsatile midline mass.  Musculoskeletal: Normal range of motion.  Lymphadenopathy:       Head (right side): No submental, no  submandibular, no tonsillar, no preauricular, no posterior auricular and no occipital adenopathy present.       Head (left side): No submental, no submandibular, no tonsillar, no preauricular, no posterior auricular and no  occipital adenopathy present.    He has cervical adenopathy.       Right cervical: Superficial cervical (bilateral mild ) adenopathy present. No deep cervical and no posterior cervical adenopathy present.      Left cervical: Superficial cervical adenopathy present. No deep cervical and no posterior cervical adenopathy present.  Neurological: He is alert and oriented to person, place, and time.  Skin: Skin is warm and dry. Capillary refill takes less than 2 seconds.  Psychiatric: He has a normal mood and affect. His behavior is normal.  Vitals reviewed.      Assessment:    Non-recurrent acute serous otitis media of left ear  Pharyngitis, unspecified etiology  Acute otitis externa of left ear, unspecified type       Plan:     Meds ordered this encounter  Medications  . amoxicillin-clavulanate (AUGMENTIN) 875-125 MG tablet    Sig: Take 1 tablet by mouth 2 (two) times daily.    Dispense:  20 tablet    Refill:  0  . neomycin-polymyxin-hydrocortisone (CORTISPORIN) OTIC solution    Sig: Place 3 drops into the left ear 4 (four) times daily.    Dispense:  10 mL    Refill:  0   Follow up with your specialist as recommended by them.  Return to [place], Cherlyn Labella, MD of no improvement in 3- 5 days with current symptoms.  Reviewed after visit summary.  Advised patient call the office or your primary care doctor for an appointment if no improvement within 72 hours or if any symptoms change or worsen at any time  Advised ER or urgent Care if after hours or on weekend. Call 911 for emergency symptoms at any time.Patinet verbalized understanding of all instructions given/reviewed and treatment plan and has no further questions or concerns at this time.    Patient verbalized understanding of all instructions given and denies any further questions at this time.

## 2018-03-19 ENCOUNTER — Other Ambulatory Visit: Payer: Self-pay

## 2018-03-20 ENCOUNTER — Other Ambulatory Visit: Payer: Self-pay

## 2018-03-20 DIAGNOSIS — N400 Enlarged prostate without lower urinary tract symptoms: Secondary | ICD-10-CM

## 2018-03-20 DIAGNOSIS — Z720 Tobacco use: Secondary | ICD-10-CM

## 2018-03-20 DIAGNOSIS — I10 Essential (primary) hypertension: Secondary | ICD-10-CM

## 2018-03-20 DIAGNOSIS — I77810 Thoracic aortic ectasia: Secondary | ICD-10-CM

## 2018-03-20 DIAGNOSIS — E786 Lipoprotein deficiency: Secondary | ICD-10-CM

## 2018-03-20 DIAGNOSIS — Z0000 Encounter for general adult medical examination without abnormal findings: Secondary | ICD-10-CM

## 2018-03-20 DIAGNOSIS — E781 Pure hyperglyceridemia: Secondary | ICD-10-CM

## 2018-03-20 DIAGNOSIS — R7989 Other specified abnormal findings of blood chemistry: Secondary | ICD-10-CM

## 2018-03-21 LAB — COMPREHENSIVE METABOLIC PANEL
A/G RATIO: 2.1 (ref 1.2–2.2)
ALBUMIN: 4.6 g/dL (ref 3.5–5.5)
ALT: 22 IU/L (ref 0–44)
AST: 20 IU/L (ref 0–40)
Alkaline Phosphatase: 58 IU/L (ref 39–117)
BILIRUBIN TOTAL: 0.4 mg/dL (ref 0.0–1.2)
BUN / CREAT RATIO: 16 (ref 9–20)
BUN: 18 mg/dL (ref 6–24)
CALCIUM: 9.5 mg/dL (ref 8.7–10.2)
CHLORIDE: 106 mmol/L (ref 96–106)
CO2: 22 mmol/L (ref 20–29)
Creatinine, Ser: 1.13 mg/dL (ref 0.76–1.27)
GFR calc Af Amer: 85 mL/min/{1.73_m2} (ref >59)
GFR, EST NON AFRICAN AMERICAN: 73 mL/min/{1.73_m2} (ref >59)
Globulin, Total: 2.2 g/dL (ref 1.5–4.5)
Glucose: 103 mg/dL — ABNORMAL HIGH (ref 65–99)
Potassium: 4.1 mmol/L (ref 3.5–5.2)
Sodium: 144 mmol/L (ref 134–144)
Total Protein: 6.8 g/dL (ref 6.0–8.5)

## 2018-03-21 LAB — CBC WITH DIFFERENTIAL/PLATELET
BASOS ABS: 0.1 10*3/uL (ref 0.0–0.2)
BASOS: 1 %
EOS (ABSOLUTE): 0.3 10*3/uL (ref 0.0–0.4)
Eos: 5 %
HEMATOCRIT: 38.7 % (ref 37.5–51.0)
HEMOGLOBIN: 12.7 g/dL — AB (ref 13.0–17.7)
IMMATURE GRANS (ABS): 0.1 10*3/uL (ref 0.0–0.1)
Immature Granulocytes: 1 %
LYMPHS ABS: 1.6 10*3/uL (ref 0.7–3.1)
LYMPHS: 23 %
MCH: 29.7 pg (ref 26.6–33.0)
MCHC: 32.8 g/dL (ref 31.5–35.7)
MCV: 90 fL (ref 79–97)
MONOCYTES: 8 %
Monocytes Absolute: 0.6 10*3/uL (ref 0.1–0.9)
Neutrophils Absolute: 4.3 10*3/uL (ref 1.4–7.0)
Neutrophils: 62 %
Platelets: 383 10*3/uL (ref 150–450)
RBC: 4.28 x10E6/uL (ref 4.14–5.80)
RDW: 12.7 % (ref 11.6–15.4)
WBC: 6.9 10*3/uL (ref 3.4–10.8)

## 2018-03-21 LAB — LIPID PANEL W/O CHOL/HDL RATIO
Cholesterol, Total: 132 mg/dL (ref 100–199)
HDL: 36 mg/dL — AB (ref >39)
LDL CALC: 69 mg/dL (ref 0–99)
Triglycerides: 135 mg/dL (ref 0–149)
VLDL CHOLESTEROL CAL: 27 mg/dL (ref 5–40)

## 2018-03-21 LAB — URINALYSIS, ROUTINE W REFLEX MICROSCOPIC
Bilirubin, UA: NEGATIVE
GLUCOSE, UA: NEGATIVE
KETONES UA: NEGATIVE
Leukocytes, UA: NEGATIVE
NITRITE UA: NEGATIVE
Protein, UA: NEGATIVE
RBC, UA: NEGATIVE
SPEC GRAV UA: 1.017 (ref 1.005–1.030)
Urobilinogen, Ur: 0.2 mg/dL (ref 0.2–1.0)
pH, UA: 5.5 (ref 5.0–7.5)

## 2018-03-21 LAB — VITAMIN D 25 HYDROXY (VIT D DEFICIENCY, FRACTURES): VIT D 25 HYDROXY: 43 ng/mL (ref 30.0–100.0)

## 2018-03-21 LAB — B12 AND FOLATE PANEL
Folate: 9.3 ng/mL (ref >3.0)
Vitamin B-12: 424 pg/mL (ref 232–1245)

## 2018-03-21 LAB — HGB A1C W/O EAG: HEMOGLOBIN A1C: 6 % — AB (ref 4.8–5.6)

## 2018-03-21 LAB — PSA: Prostate Specific Ag, Serum: 1.4 ng/mL (ref 0.0–4.0)

## 2018-03-21 LAB — TSH+FREE T4
Free T4: 1.02 ng/dL (ref 0.82–1.77)
TSH: 4.8 u[IU]/mL — AB (ref 0.450–4.500)

## 2018-03-21 LAB — SPECIMEN STATUS REPORT

## 2018-03-21 LAB — MAGNESIUM: Magnesium: 2.2 mg/dL (ref 1.6–2.3)

## 2018-03-25 LAB — LIPID CASCADE W/RFLX TO APOLIB
CHOLESTEROL TOTAL: 135 mg/dL (ref 100–199)
HDL: 35 mg/dL — AB (ref >39)
LDL Calculated: 72 mg/dL (ref 0–99)
LDl/HDL Ratio: 2.1 ratio (ref 0.0–3.6)
TRIGLYCERIDES: 141 mg/dL (ref 0–149)
Total Non-HDL-Chol (LDL+VLDL): 100 mg/dL (ref 0–129)

## 2018-03-25 LAB — APOLIPOPROTEIN B: Apolipoprotein B: 75 mg/dL (ref <90)

## 2018-05-14 ENCOUNTER — Other Ambulatory Visit: Payer: Self-pay | Admitting: Physician Assistant

## 2018-05-14 DIAGNOSIS — I77810 Thoracic aortic ectasia: Secondary | ICD-10-CM

## 2018-05-19 ENCOUNTER — Ambulatory Visit
Admission: RE | Admit: 2018-05-19 | Discharge: 2018-05-19 | Disposition: A | Discharge status: Home / Self Care | Payer: BLUE CROSS/BLUE SHIELD | Source: Ambulatory Visit | Attending: Physician Assistant | Admitting: Physician Assistant

## 2018-05-19 DIAGNOSIS — I77810 Thoracic aortic ectasia: Secondary | ICD-10-CM | POA: Diagnosis not present

## 2018-05-19 LAB — POCT I-STAT CREATININE: Creatinine, Ser: 1.1 mg/dL (ref 0.61–1.24)

## 2018-05-19 MED ORDER — IOPAMIDOL (ISOVUE-370) INJECTION 76%
75.0000 mL | Freq: Once | INTRAVENOUS | Status: AC | PRN
  Administered 2018-05-19: 75 mL via INTRAVENOUS

## 2018-09-09 ENCOUNTER — Other Ambulatory Visit: Payer: Self-pay

## 2018-09-09 DIAGNOSIS — I10 Essential (primary) hypertension: Secondary | ICD-10-CM

## 2018-09-09 DIAGNOSIS — G4733 Obstructive sleep apnea (adult) (pediatric): Secondary | ICD-10-CM

## 2018-09-09 DIAGNOSIS — E786 Lipoprotein deficiency: Secondary | ICD-10-CM

## 2018-09-09 DIAGNOSIS — I351 Nonrheumatic aortic (valve) insufficiency: Secondary | ICD-10-CM

## 2018-09-09 DIAGNOSIS — Z6835 Body mass index (BMI) 35.0-35.9, adult: Secondary | ICD-10-CM

## 2018-09-09 DIAGNOSIS — N401 Enlarged prostate with lower urinary tract symptoms: Secondary | ICD-10-CM

## 2018-09-09 DIAGNOSIS — Z720 Tobacco use: Secondary | ICD-10-CM

## 2018-09-09 DIAGNOSIS — I77810 Thoracic aortic ectasia: Secondary | ICD-10-CM

## 2018-09-10 LAB — COMPREHENSIVE METABOLIC PANEL
ALT: 20 IU/L (ref 0–44)
AST: 21 IU/L (ref 0–40)
Albumin/Globulin Ratio: 2 (ref 1.2–2.2)
Albumin: 4.7 g/dL (ref 3.8–4.9)
Alkaline Phosphatase: 58 IU/L (ref 39–117)
BUN/Creatinine Ratio: 16 (ref 9–20)
BUN: 18 mg/dL (ref 6–24)
Bilirubin Total: 0.5 mg/dL (ref 0.0–1.2)
CO2: 17 mmol/L — ABNORMAL LOW (ref 20–29)
Calcium: 9.6 mg/dL (ref 8.7–10.2)
Chloride: 106 mmol/L (ref 96–106)
Creatinine, Ser: 1.16 mg/dL (ref 0.76–1.27)
GFR calc Af Amer: 82 mL/min/{1.73_m2} (ref >59)
GFR calc non Af Amer: 71 mL/min/{1.73_m2} (ref >59)
Globulin, Total: 2.3 g/dL (ref 1.5–4.5)
Glucose: 97 mg/dL (ref 65–99)
Potassium: 3.9 mmol/L (ref 3.5–5.2)
Sodium: 140 mmol/L (ref 134–144)
Total Protein: 7 g/dL (ref 6.0–8.5)

## 2018-09-10 LAB — CBC WITH DIFFERENTIAL/PLATELET
Basophils Absolute: 0.1 10*3/uL (ref 0.0–0.2)
Basos: 1 %
EOS (ABSOLUTE): 0.2 10*3/uL (ref 0.0–0.4)
Eos: 4 %
Hematocrit: 38.3 % (ref 37.5–51.0)
Hemoglobin: 12.7 g/dL — ABNORMAL LOW (ref 13.0–17.7)
Immature Grans (Abs): 0 10*3/uL (ref 0.0–0.1)
Immature Granulocytes: 1 %
Lymphocytes Absolute: 1.5 10*3/uL (ref 0.7–3.1)
Lymphs: 23 %
MCH: 30.5 pg (ref 26.6–33.0)
MCHC: 33.2 g/dL (ref 31.5–35.7)
MCV: 92 fL (ref 79–97)
Monocytes Absolute: 0.4 10*3/uL (ref 0.1–0.9)
Monocytes: 7 %
Neutrophils Absolute: 4.1 10*3/uL (ref 1.4–7.0)
Neutrophils: 64 %
Platelets: 368 10*3/uL (ref 150–450)
RBC: 4.17 x10E6/uL (ref 4.14–5.80)
RDW: 12.5 % (ref 11.6–15.4)
WBC: 6.3 10*3/uL (ref 3.4–10.8)

## 2018-09-10 LAB — URINALYSIS, ROUTINE W REFLEX MICROSCOPIC
Bilirubin, UA: NEGATIVE
Glucose, UA: NEGATIVE
Ketones, UA: NEGATIVE
Leukocytes,UA: NEGATIVE
Nitrite, UA: NEGATIVE
Protein,UA: NEGATIVE
RBC, UA: NEGATIVE
Specific Gravity, UA: 1.017 (ref 1.005–1.030)
Urobilinogen, Ur: 0.2 mg/dL (ref 0.2–1.0)
pH, UA: 5 (ref 5.0–7.5)

## 2018-09-10 LAB — LIPID PANEL
Chol/HDL Ratio: 3.3 ratio (ref 0.0–5.0)
Cholesterol, Total: 119 mg/dL (ref 100–199)
HDL: 36 mg/dL — ABNORMAL LOW (ref >39)
LDL Calculated: 62 mg/dL (ref 0–99)
Triglycerides: 103 mg/dL (ref 0–149)
VLDL Cholesterol Cal: 21 mg/dL (ref 5–40)

## 2018-09-10 LAB — HGB A1C W/O EAG: Hgb A1c MFr Bld: 6.1 % — ABNORMAL HIGH (ref 4.8–5.6)

## 2018-09-10 LAB — VITAMIN D 25 HYDROXY (VIT D DEFICIENCY, FRACTURES): Vit D, 25-Hydroxy: 43 ng/mL (ref 30.0–100.0)

## 2018-09-10 LAB — TSH+FREE T4
Free T4: 0.97 ng/dL (ref 0.82–1.77)
TSH: 4.81 u[IU]/mL — ABNORMAL HIGH (ref 0.450–4.500)

## 2018-09-10 LAB — B12 AND FOLATE PANEL
Folate: 6.5 ng/mL (ref >3.0)
Vitamin B-12: 344 pg/mL (ref 232–1245)

## 2018-09-10 LAB — MAGNESIUM: Magnesium: 2.3 mg/dL (ref 1.6–2.3)

## 2018-09-10 LAB — PSA: Prostate Specific Ag, Serum: 1.5 ng/mL (ref 0.0–4.0)

## 2018-09-22 ENCOUNTER — Other Ambulatory Visit
Admission: RE | Admit: 2018-09-22 | Discharge: 2018-09-22 | Disposition: A | Discharge status: Home / Self Care | Payer: BC Managed Care – PPO | Source: Ambulatory Visit | Attending: Internal Medicine | Admitting: Internal Medicine

## 2018-09-22 ENCOUNTER — Other Ambulatory Visit: Payer: Self-pay

## 2018-09-22 DIAGNOSIS — Z1159 Encounter for screening for other viral diseases: Secondary | ICD-10-CM | POA: Insufficient documentation

## 2018-09-23 LAB — SARS CORONAVIRUS 2 (TAT 6-24 HRS): SARS Coronavirus 2: NEGATIVE

## 2018-09-25 ENCOUNTER — Encounter: Payer: Self-pay | Admitting: *Deleted

## 2018-09-25 ENCOUNTER — Other Ambulatory Visit: Payer: Self-pay

## 2018-09-25 ENCOUNTER — Ambulatory Visit
Admission: RE | Admit: 2018-09-25 | Discharge: 2018-09-25 | Disposition: A | Discharge status: Home / Self Care | Payer: BC Managed Care – PPO | Attending: Internal Medicine | Admitting: Internal Medicine

## 2018-09-25 ENCOUNTER — Encounter
Admission: RE | Disposition: A | Discharge status: Home / Self Care | Payer: Self-pay | Source: Home / Self Care | Attending: Internal Medicine

## 2018-09-25 DIAGNOSIS — Z8249 Family history of ischemic heart disease and other diseases of the circulatory system: Secondary | ICD-10-CM | POA: Diagnosis not present

## 2018-09-25 DIAGNOSIS — Z79899 Other long term (current) drug therapy: Secondary | ICD-10-CM | POA: Diagnosis not present

## 2018-09-25 DIAGNOSIS — Z7982 Long term (current) use of aspirin: Secondary | ICD-10-CM | POA: Diagnosis not present

## 2018-09-25 DIAGNOSIS — I739 Peripheral vascular disease, unspecified: Secondary | ICD-10-CM | POA: Diagnosis not present

## 2018-09-25 DIAGNOSIS — E559 Vitamin D deficiency, unspecified: Secondary | ICD-10-CM | POA: Diagnosis not present

## 2018-09-25 DIAGNOSIS — I351 Nonrheumatic aortic (valve) insufficiency: Secondary | ICD-10-CM | POA: Diagnosis not present

## 2018-09-25 DIAGNOSIS — I10 Essential (primary) hypertension: Secondary | ICD-10-CM | POA: Insufficient documentation

## 2018-09-25 DIAGNOSIS — G4730 Sleep apnea, unspecified: Secondary | ICD-10-CM | POA: Insufficient documentation

## 2018-09-25 DIAGNOSIS — R079 Chest pain, unspecified: Secondary | ICD-10-CM

## 2018-09-25 DIAGNOSIS — E785 Hyperlipidemia, unspecified: Secondary | ICD-10-CM | POA: Insufficient documentation

## 2018-09-25 DIAGNOSIS — Z87891 Personal history of nicotine dependence: Secondary | ICD-10-CM | POA: Insufficient documentation

## 2018-09-25 DIAGNOSIS — Z823 Family history of stroke: Secondary | ICD-10-CM | POA: Diagnosis not present

## 2018-09-25 DIAGNOSIS — I209 Angina pectoris, unspecified: Secondary | ICD-10-CM | POA: Insufficient documentation

## 2018-09-25 DIAGNOSIS — R0602 Shortness of breath: Secondary | ICD-10-CM | POA: Insufficient documentation

## 2018-09-25 DIAGNOSIS — I77810 Thoracic aortic ectasia: Secondary | ICD-10-CM | POA: Diagnosis present

## 2018-09-25 HISTORY — PX: LEFT HEART CATH AND CORONARY ANGIOGRAPHY: CATH118249

## 2018-09-25 SURGERY — LEFT HEART CATH AND CORONARY ANGIOGRAPHY
Anesthesia: Moderate Sedation | Laterality: Left

## 2018-09-25 MED ORDER — SODIUM CHLORIDE 0.9 % WEIGHT BASED INFUSION
3.0000 mL/kg/h | INTRAVENOUS | Status: AC
  Administered 2018-09-25: 07:00:00 3 mL/kg/h via INTRAVENOUS

## 2018-09-25 MED ORDER — HEPARIN SODIUM (PORCINE) 1000 UNIT/ML IJ SOLN
INTRAMUSCULAR | Status: AC
  Filled 2018-09-25: qty 1

## 2018-09-25 MED ORDER — MIDAZOLAM HCL 2 MG/2ML IJ SOLN
INTRAMUSCULAR | Status: AC
  Filled 2018-09-25: qty 2

## 2018-09-25 MED ORDER — SODIUM CHLORIDE 0.9% FLUSH: 3.0000 mL | INTRAVENOUS | Status: DC | PRN

## 2018-09-25 MED ORDER — ASPIRIN 81 MG PO CHEW
81.0000 mg | CHEWABLE_TABLET | ORAL | Status: AC
  Administered 2018-09-25: 07:00:00 81 mg via ORAL

## 2018-09-25 MED ORDER — SODIUM CHLORIDE 0.9 % WEIGHT BASED INFUSION: 1.0000 mL/kg/h | INTRAVENOUS | Status: DC

## 2018-09-25 MED ORDER — MIDAZOLAM HCL 2 MG/2ML IJ SOLN
INTRAMUSCULAR | Status: DC | PRN
  Administered 2018-09-25: 1 mg via INTRAVENOUS

## 2018-09-25 MED ORDER — HEPARIN (PORCINE) IN NACL 1000-0.9 UT/500ML-% IV SOLN
INTRAVENOUS | Status: AC
  Filled 2018-09-25: qty 1000

## 2018-09-25 MED ORDER — FENTANYL CITRATE (PF) 100 MCG/2ML IJ SOLN
INTRAMUSCULAR | Status: DC | PRN
  Administered 2018-09-25: 50 ug via INTRAVENOUS

## 2018-09-25 MED ORDER — LABETALOL HCL 5 MG/ML IV SOLN: 10.0000 mg | INTRAVENOUS | Status: DC | PRN

## 2018-09-25 MED ORDER — SODIUM CHLORIDE 0.9 % IV SOLN: 250.0000 mL | INTRAVENOUS | Status: DC | PRN

## 2018-09-25 MED ORDER — IOHEXOL 300 MG/ML  SOLN
INTRAMUSCULAR | Status: DC | PRN
  Administered 2018-09-25: 09:00:00 130 mL via INTRA_ARTERIAL

## 2018-09-25 MED ORDER — ASPIRIN 81 MG PO CHEW
CHEWABLE_TABLET | ORAL | Status: AC
  Filled 2018-09-25: qty 1

## 2018-09-25 MED ORDER — ONDANSETRON HCL 4 MG/2ML IJ SOLN: 4.0000 mg | Freq: Four times a day (QID) | INTRAMUSCULAR | Status: DC | PRN

## 2018-09-25 MED ORDER — FENTANYL CITRATE (PF) 100 MCG/2ML IJ SOLN
INTRAMUSCULAR | Status: AC
  Filled 2018-09-25: qty 2

## 2018-09-25 MED ORDER — ACETAMINOPHEN 325 MG PO TABS: 650.0000 mg | ORAL_TABLET | ORAL | Status: DC | PRN

## 2018-09-25 MED ORDER — HYDRALAZINE HCL 20 MG/ML IJ SOLN: 10.0000 mg | INTRAMUSCULAR | Status: DC | PRN

## 2018-09-25 MED ORDER — SODIUM CHLORIDE 0.9% FLUSH: 3.0000 mL | Freq: Two times a day (BID) | INTRAVENOUS | Status: DC

## 2018-09-25 MED ORDER — VERAPAMIL HCL 2.5 MG/ML IV SOLN
INTRAVENOUS | Status: AC
  Filled 2018-09-25: qty 2

## 2018-09-25 SURGICAL SUPPLY — 12 items
CATH INFINITI 5FR ANG PIGTAIL (CATHETERS) ×3 IMPLANT
CATH INFINITI 5FR JL4 (CATHETERS) IMPLANT
CATH INFINITI 5FR JL5 (CATHETERS) ×3 IMPLANT
CATH INFINITI JR4 5F (CATHETERS) ×3 IMPLANT
DEVICE CLOSURE MYNXGRIP 5F (Vascular Products) ×3 IMPLANT
GLIDESHEATH SLEND SS 6F .021 (SHEATH) ×3 IMPLANT
KIT MANI 3VAL PERCEP (MISCELLANEOUS) ×3 IMPLANT
NEEDLE PERC 18GX7CM (NEEDLE) ×3 IMPLANT
PACK CARDIAC CATH (CUSTOM PROCEDURE TRAY) ×3 IMPLANT
SHEATH AVANTI 5FR X 11CM (SHEATH) ×3 IMPLANT
WIRE GUIDERIGHT .035X150 (WIRE) ×3 IMPLANT
WIRE ROSEN-J .035X260CM (WIRE) ×3 IMPLANT

## 2018-09-29 ENCOUNTER — Encounter: Payer: Self-pay | Admitting: Adult Health

## 2018-12-10 DIAGNOSIS — U071 COVID-19: Secondary | ICD-10-CM

## 2018-12-10 HISTORY — DX: COVID-19: U07.1

## 2018-12-11 ENCOUNTER — Emergency Department: Payer: BC Managed Care – PPO

## 2018-12-11 ENCOUNTER — Encounter (HOSPITAL_COMMUNITY): Payer: Self-pay | Admitting: Internal Medicine

## 2018-12-11 ENCOUNTER — Encounter: Payer: Self-pay | Admitting: Emergency Medicine

## 2018-12-11 ENCOUNTER — Inpatient Hospital Stay (HOSPITAL_COMMUNITY)
Admission: AD | Admit: 2018-12-11 | Discharge: 2018-12-15 | DRG: 177 | Disposition: A | Discharge status: Home / Self Care | Payer: BC Managed Care – PPO | Source: Other Acute Inpatient Hospital | Attending: Internal Medicine | Admitting: Internal Medicine

## 2018-12-11 ENCOUNTER — Emergency Department
Admission: EM | Admit: 2018-12-11 | Discharge: 2018-12-11 | Disposition: A | Discharge status: Other Acute Inpatient Hospital | Payer: BC Managed Care – PPO | Attending: Emergency Medicine | Admitting: Emergency Medicine

## 2018-12-11 ENCOUNTER — Other Ambulatory Visit: Payer: Self-pay

## 2018-12-11 DIAGNOSIS — E785 Hyperlipidemia, unspecified: Secondary | ICD-10-CM | POA: Diagnosis present

## 2018-12-11 DIAGNOSIS — J1289 Other viral pneumonia: Secondary | ICD-10-CM | POA: Diagnosis present

## 2018-12-11 DIAGNOSIS — N401 Enlarged prostate with lower urinary tract symptoms: Secondary | ICD-10-CM | POA: Diagnosis present

## 2018-12-11 DIAGNOSIS — F419 Anxiety disorder, unspecified: Secondary | ICD-10-CM | POA: Diagnosis present

## 2018-12-11 DIAGNOSIS — Z801 Family history of malignant neoplasm of trachea, bronchus and lung: Secondary | ICD-10-CM | POA: Diagnosis not present

## 2018-12-11 DIAGNOSIS — R7401 Elevation of levels of liver transaminase levels: Secondary | ICD-10-CM | POA: Diagnosis present

## 2018-12-11 DIAGNOSIS — J9600 Acute respiratory failure, unspecified whether with hypoxia or hypercapnia: Secondary | ICD-10-CM

## 2018-12-11 DIAGNOSIS — R0902 Hypoxemia: Secondary | ICD-10-CM | POA: Insufficient documentation

## 2018-12-11 DIAGNOSIS — Z7982 Long term (current) use of aspirin: Secondary | ICD-10-CM

## 2018-12-11 DIAGNOSIS — I77810 Thoracic aortic ectasia: Secondary | ICD-10-CM | POA: Diagnosis present

## 2018-12-11 DIAGNOSIS — J9601 Acute respiratory failure with hypoxia: Secondary | ICD-10-CM | POA: Diagnosis present

## 2018-12-11 DIAGNOSIS — Z23 Encounter for immunization: Secondary | ICD-10-CM | POA: Diagnosis not present

## 2018-12-11 DIAGNOSIS — E860 Dehydration: Secondary | ICD-10-CM | POA: Diagnosis present

## 2018-12-11 DIAGNOSIS — Z87891 Personal history of nicotine dependence: Secondary | ICD-10-CM | POA: Diagnosis not present

## 2018-12-11 DIAGNOSIS — N179 Acute kidney failure, unspecified: Secondary | ICD-10-CM | POA: Diagnosis not present

## 2018-12-11 DIAGNOSIS — Z720 Tobacco use: Secondary | ICD-10-CM

## 2018-12-11 DIAGNOSIS — Z6835 Body mass index (BMI) 35.0-35.9, adult: Secondary | ICD-10-CM | POA: Diagnosis not present

## 2018-12-11 DIAGNOSIS — R739 Hyperglycemia, unspecified: Secondary | ICD-10-CM | POA: Diagnosis not present

## 2018-12-11 DIAGNOSIS — I10 Essential (primary) hypertension: Secondary | ICD-10-CM | POA: Insufficient documentation

## 2018-12-11 DIAGNOSIS — I351 Nonrheumatic aortic (valve) insufficiency: Secondary | ICD-10-CM | POA: Diagnosis present

## 2018-12-11 DIAGNOSIS — G4733 Obstructive sleep apnea (adult) (pediatric): Secondary | ICD-10-CM | POA: Diagnosis present

## 2018-12-11 DIAGNOSIS — Z79899 Other long term (current) drug therapy: Secondary | ICD-10-CM

## 2018-12-11 DIAGNOSIS — R0602 Shortness of breath: Secondary | ICD-10-CM

## 2018-12-11 DIAGNOSIS — E861 Hypovolemia: Secondary | ICD-10-CM | POA: Diagnosis present

## 2018-12-11 DIAGNOSIS — U071 COVID-19: Principal | ICD-10-CM

## 2018-12-11 DIAGNOSIS — D72819 Decreased white blood cell count, unspecified: Secondary | ICD-10-CM | POA: Diagnosis present

## 2018-12-11 DIAGNOSIS — Z79891 Long term (current) use of opiate analgesic: Secondary | ICD-10-CM | POA: Diagnosis not present

## 2018-12-11 DIAGNOSIS — J1282 Pneumonia due to coronavirus disease 2019: Secondary | ICD-10-CM

## 2018-12-11 DIAGNOSIS — R3912 Poor urinary stream: Secondary | ICD-10-CM

## 2018-12-11 DIAGNOSIS — T380X5A Adverse effect of glucocorticoids and synthetic analogues, initial encounter: Secondary | ICD-10-CM | POA: Diagnosis not present

## 2018-12-11 LAB — COMPREHENSIVE METABOLIC PANEL
ALT: 43 U/L (ref 0–44)
AST: 69 U/L — ABNORMAL HIGH (ref 15–41)
Albumin: 4.1 g/dL (ref 3.5–5.0)
Alkaline Phosphatase: 82 U/L (ref 38–126)
Anion gap: 15 (ref 5–15)
BUN: 21 mg/dL — ABNORMAL HIGH (ref 6–20)
CO2: 23 mmol/L (ref 22–32)
Calcium: 8.9 mg/dL (ref 8.9–10.3)
Chloride: 104 mmol/L (ref 98–111)
Creatinine, Ser: 0.91 mg/dL (ref 0.61–1.24)
GFR calc Af Amer: >60 mL/min (ref >60)
GFR calc non Af Amer: >60 mL/min (ref >60)
Glucose, Bld: 120 mg/dL — ABNORMAL HIGH (ref 70–99)
Potassium: 3.8 mmol/L (ref 3.5–5.1)
Sodium: 142 mmol/L (ref 135–145)
Total Bilirubin: 1.7 mg/dL — ABNORMAL HIGH (ref 0.3–1.2)
Total Protein: 8 g/dL (ref 6.5–8.1)

## 2018-12-11 LAB — CBC WITH DIFFERENTIAL/PLATELET
Abs Immature Granulocytes: 0.02 10*3/uL (ref 0.00–0.07)
Basophils Absolute: 0 10*3/uL (ref 0.0–0.1)
Basophils Relative: 0 %
Eosinophils Absolute: 0 10*3/uL (ref 0.0–0.5)
Eosinophils Relative: 0 %
HCT: 39.5 % (ref 39.0–52.0)
Hemoglobin: 13.1 g/dL (ref 13.0–17.0)
Immature Granulocytes: 0 %
Lymphocytes Relative: 17 %
Lymphs Abs: 1 10*3/uL (ref 0.7–4.0)
MCH: 30 pg (ref 26.0–34.0)
MCHC: 33.2 g/dL (ref 30.0–36.0)
MCV: 90.4 fL (ref 80.0–100.0)
Monocytes Absolute: 0.5 10*3/uL (ref 0.1–1.0)
Monocytes Relative: 8 %
Neutro Abs: 4.2 10*3/uL (ref 1.7–7.7)
Neutrophils Relative %: 75 %
Platelets: 284 10*3/uL (ref 150–400)
RBC: 4.37 MIL/uL (ref 4.22–5.81)
RDW: 13.1 % (ref 11.5–15.5)
WBC: 5.6 10*3/uL (ref 4.0–10.5)
nRBC: 0 % (ref 0.0–0.2)

## 2018-12-11 LAB — LACTATE DEHYDROGENASE: LDH: 330 U/L — ABNORMAL HIGH (ref 98–192)

## 2018-12-11 LAB — URINALYSIS, COMPLETE (UACMP) WITH MICROSCOPIC
Bacteria, UA: NONE SEEN
Glucose, UA: 50 mg/dL — AB
Hgb urine dipstick: NEGATIVE
Ketones, ur: NEGATIVE mg/dL
Leukocytes,Ua: NEGATIVE
Nitrite: NEGATIVE
Protein, ur: 30 mg/dL — AB
Specific Gravity, Urine: 1.026 (ref 1.005–1.030)
Squamous Epithelial / HPF: NONE SEEN (ref 0–5)
pH: 5 (ref 5.0–8.0)

## 2018-12-11 LAB — TRIGLYCERIDES: Triglycerides: 240 mg/dL — ABNORMAL HIGH (ref <150)

## 2018-12-11 LAB — FERRITIN: Ferritin: 2479 ng/mL — ABNORMAL HIGH (ref 24–336)

## 2018-12-11 LAB — PROCALCITONIN: Procalcitonin: <0.1 ng/mL

## 2018-12-11 LAB — LACTIC ACID, PLASMA: Lactic Acid, Venous: 1.2 mmol/L (ref 0.5–1.9)

## 2018-12-11 LAB — FIBRIN DERIVATIVES D-DIMER (ARMC ONLY): Fibrin derivatives D-dimer (ARMC): 750.59 ng/mL (FEU) — ABNORMAL HIGH (ref 0.00–499.00)

## 2018-12-11 LAB — FIBRINOGEN: Fibrinogen: 630 mg/dL — ABNORMAL HIGH (ref 210–475)

## 2018-12-11 LAB — C-REACTIVE PROTEIN: CRP: 1.8 mg/dL — ABNORMAL HIGH (ref <1.0)

## 2018-12-11 MED ORDER — DEXAMETHASONE SODIUM PHOSPHATE 10 MG/ML IJ SOLN: 10.0000 mg | Freq: Once | INTRAMUSCULAR | Status: DC

## 2018-12-11 MED ORDER — ASPIRIN EC 81 MG PO TBEC
81.0000 mg | DELAYED_RELEASE_TABLET | Freq: Every evening | ORAL | Status: DC
  Administered 2018-12-11 – 2018-12-14 (×4): 81 mg via ORAL
  Filled 2018-12-11 (×5): qty 1

## 2018-12-11 MED ORDER — DEXAMETHASONE SODIUM PHOSPHATE 10 MG/ML IJ SOLN
6.0000 mg | Freq: Every day | INTRAMUSCULAR | Status: DC
  Administered 2018-12-11 – 2018-12-12 (×2): 6 mg via INTRAVENOUS
  Filled 2018-12-11 (×2): qty 1

## 2018-12-11 MED ORDER — SODIUM CHLORIDE 0.9% FLUSH: 3.0000 mL | INTRAVENOUS | Status: DC | PRN

## 2018-12-11 MED ORDER — ACETAMINOPHEN 325 MG PO TABS: 650.0000 mg | ORAL_TABLET | Freq: Four times a day (QID) | ORAL | Status: DC | PRN

## 2018-12-11 MED ORDER — TRAMADOL HCL 50 MG PO TABS: 50.0000 mg | ORAL_TABLET | Freq: Four times a day (QID) | ORAL | Status: DC | PRN

## 2018-12-11 MED ORDER — IPRATROPIUM-ALBUTEROL 20-100 MCG/ACT IN AERS
1.0000 | INHALATION_SPRAY | Freq: Four times a day (QID) | RESPIRATORY_TRACT | Status: DC
  Administered 2018-12-12 – 2018-12-15 (×12): 1 via RESPIRATORY_TRACT
  Filled 2018-12-11: qty 4

## 2018-12-11 MED ORDER — METHYLPREDNISOLONE SODIUM SUCC 125 MG IJ SOLR
60.0000 mg | Freq: Once | INTRAMUSCULAR | Status: AC
  Administered 2018-12-11: 14:00:00 60 mg via INTRAVENOUS
  Filled 2018-12-11: qty 2

## 2018-12-11 MED ORDER — LOSARTAN POTASSIUM 25 MG PO TABS
100.0000 mg | ORAL_TABLET | Freq: Every day | ORAL | Status: DC
  Administered 2018-12-12: 100 mg via ORAL
  Filled 2018-12-11: qty 4

## 2018-12-11 MED ORDER — SODIUM CHLORIDE 0.9 % IV SOLN
200.0000 mg | Freq: Once | INTRAVENOUS | Status: AC
  Administered 2018-12-11: 200 mg via INTRAVENOUS
  Filled 2018-12-11: qty 40

## 2018-12-11 MED ORDER — ENOXAPARIN SODIUM 40 MG/0.4ML ~~LOC~~ SOLN
40.0000 mg | Freq: Every day | SUBCUTANEOUS | Status: DC
  Administered 2018-12-11: 40 mg via SUBCUTANEOUS
  Filled 2018-12-11: qty 0.4

## 2018-12-11 MED ORDER — ROSUVASTATIN CALCIUM 5 MG PO TABS
10.0000 mg | ORAL_TABLET | Freq: Every evening | ORAL | Status: DC
  Administered 2018-12-11 – 2018-12-12 (×2): 10 mg via ORAL
  Filled 2018-12-11 (×2): qty 2

## 2018-12-11 MED ORDER — SODIUM CHLORIDE 0.9 % IV SOLN: 250.0000 mL | INTRAVENOUS | Status: DC | PRN

## 2018-12-11 MED ORDER — DOXAZOSIN MESYLATE 2 MG PO TABS
2.0000 mg | ORAL_TABLET | Freq: Every day | ORAL | Status: DC
  Administered 2018-12-12: 2 mg via ORAL
  Filled 2018-12-11 (×2): qty 1

## 2018-12-11 MED ORDER — SODIUM CHLORIDE 0.9 % IV SOLN
100.0000 mg | INTRAVENOUS | Status: AC
  Administered 2018-12-12 – 2018-12-15 (×4): 100 mg via INTRAVENOUS
  Filled 2018-12-11 (×4): qty 20

## 2018-12-11 MED ORDER — FENOFIBRATE 160 MG PO TABS
160.0000 mg | ORAL_TABLET | Freq: Every day | ORAL | Status: DC
  Administered 2018-12-12: 160 mg via ORAL
  Filled 2018-12-11 (×2): qty 1

## 2018-12-11 MED ORDER — GUAIFENESIN-DM 100-10 MG/5ML PO SYRP: 10.0000 mL | ORAL_SOLUTION | ORAL | Status: DC | PRN

## 2018-12-11 MED ORDER — METOPROLOL SUCCINATE ER 25 MG PO TB24
25.0000 mg | ORAL_TABLET | Freq: Every evening | ORAL | Status: DC
  Administered 2018-12-12 (×2): 25 mg via ORAL
  Filled 2018-12-11 (×2): qty 1

## 2018-12-11 NOTE — Assessment & Plan Note (Signed)
Admit to medical bed.  Start IV remdesivir.  Patient given 125 mg of IV Solu-Medrol.  Will start oral Decadron 4 mg daily tomorrow.  PRN Combivent Respimat inhaler.  RT for assessment.  Discussed with the patient if his shortness of breath worsenings, he may be a candidate for either convalescent plasma and/or Actemra.

## 2018-12-11 NOTE — ED Notes (Signed)
Consent signed by pt for transfer.  Pt alert  nsr on monitor.

## 2018-12-11 NOTE — ED Provider Notes (Signed)
-----------------------------------------   8:04 PM on 12/11/2018 -----------------------------------------  Notified by nursing that patient now with O2 sats dropping into the mid 80s and patient with worsening work of breathing.  Nasal cannula was increased from 4 L to 6 L with no significant improvement.  Patient to be placed on nonrebreather for now and respiratory contacted about starting high flow nasal cannula.  Patient states that he got up to go to the bathroom without oxygen and when he got back to bed, he was feeling much worse.  This is when his sats were noticed to be in the 80s.  He was placed on high flow nasal cannula with improvement, subsequently was able to be transitioned to 6 L by nasal cannula with no respiratory distress or drop in sats.  Patient appropriate for transfer on 6 L by nasal cannula.   Blake Divine, MD 12/11/18 2056

## 2018-12-11 NOTE — ED Notes (Signed)
[facility] here now.

## 2018-12-11 NOTE — Assessment & Plan Note (Signed)
Stable.  Followed by cardiology.   

## 2018-12-11 NOTE — ED Provider Notes (Signed)
[facility] Emergency Department Provider Note       Time seen: ----------------------------------------- 11:34 AM on 12/11/2018 -----------------------------------------   I have reviewed the triage vital signs and the nursing notes.  HISTORY   Chief Complaint Shortness of Breath, Headache, and Covid +    HPI Billy Herman is a 53 y.o. male with a history of hyperlipidemia, hypertension, sleep apnea who presents to the ED for increased shortness of breath with headache and decreased appetite.  He was sent by his doctor for further evaluation.  Sats were 86% on room air.  Reportedly he is COVID positive.  Past Medical History:  Diagnosis Date  . Hyperlipidemia   . Hypertension   . Low serum vitamin D   . Sleep apnea     Patient Active Problem List   Diagnosis Date Noted  . Benign prostatic hyperplasia with weak urinary stream 03/06/2017  . Ascending aorta dilation (HCC) 10/22/2016  . Moderate aortic valve insufficiency 10/22/2016  . Ascending aorta dilation (HCC) 10/22/2016  . OSA (obstructive sleep apnea) 09/19/2016  . SOBOE (shortness of breath on exertion) 09/19/2016  . Hypertension 07/26/2016  . Low serum vitamin D 07/26/2016  . Tobacco user 09/05/2015  . Hypertriglyceridemia 02/22/2015  . Low HDL (under 40) 02/22/2015    Past Surgical History:  Procedure Laterality Date  . APPENDECTOMY    . COLONOSCOPY WITH PROPOFOL N/A 12/01/2015   Procedure: COLONOSCOPY WITH PROPOFOL;  Surgeon: Lollie Sails, MD;  Location: [facility] ENDOSCOPY;  Service: Endoscopy;  Laterality: N/A;  . HERNIA REPAIR    . inguial hernia  Bilateral   . LEFT HEART CATH AND CORONARY ANGIOGRAPHY Left 09/25/2018   Procedure: LEFT HEART CATH AND CORONARY ANGIOGRAPHY;  Surgeon: Corey Skains, MD;  Location: [place] CV LAB;  Service: Cardiovascular;  Laterality: Left;    Allergies Patient has no known allergies.  Social History Social History   Tobacco Use  .  Smoking status: Former Research scientist (life sciences)  . Smokeless tobacco: Current User  Substance Use Topics  . Alcohol use: Not on file  . Drug use: Not on file   Review of Systems Constitutional: Negative for fever. Cardiovascular: Negative for chest pain. Respiratory: Positive for shortness of breath Gastrointestinal: Negative for abdominal pain, vomiting and diarrhea.  Positive for poor appetite Musculoskeletal: Negative for back pain. Skin: Negative for rash. Neurological: Positive for headache  All systems negative/normal/unremarkable except as stated in the HPI  ____________________________________________   PHYSICAL EXAM:  VITAL SIGNS: ED Triage Vitals  Enc Vitals Group     BP 12/11/18 1118 110/77     Pulse Rate 12/11/18 1118 83     Resp 12/11/18 1118 20     Temp 12/11/18 1118 97.9 F (36.6 C)     Temp Source 12/11/18 1118 Oral     SpO2 12/11/18 1118 (!) 86 %     Weight 12/11/18 1119 265 lb (120.2 kg)     Height 12/11/18 1119 6\' 1"  (1.854 m)     Head Circumference --      Peak Flow --      Pain Score 12/11/18 1119 5     Pain Loc --      Pain Edu? --      Excl. in [place]? --    Constitutional: Alert and oriented.  Mild distress Eyes: Conjunctivae are normal. Normal extraocular movements. Cardiovascular: Normal rate, regular rhythm. No murmurs, rubs, or gallops. Respiratory: Tachypnea with clear breath sounds Gastrointestinal: Soft and nontender. Normal bowel sounds  Musculoskeletal: Nontender with normal range of motion in extremities. No lower extremity tenderness nor edema. Neurologic:  Normal speech and language. No gross focal neurologic deficits are appreciated.  Skin:  Skin is warm, dry and intact. No rash noted. Psychiatric: Mood and affect are normal. Speech and behavior are normal.  ____________________________________________  ED COURSE:  As part of my medical decision making, I reviewed the following data within the [place] History obtained from family  if available, nursing notes, old chart and ekg, as well as notes from prior ED visits. Patient presented for dyspnea with a previously positive COVID test, we will assess with labs and imaging as indicated at this time.   Procedures  Billy Herman was evaluated in Emergency Department on 12/11/2018 for the symptoms described in the history of present illness. He was evaluated in the context of the global COVID-19 pandemic, which necessitated consideration that the patient might be at risk for infection with the SARS-CoV-2 virus that causes COVID-19. Institutional protocols and algorithms that pertain to the evaluation of patients at risk for COVID-19 are in a state of rapid change based on information released by regulatory bodies including the CDC and federal and state organizations. These policies and algorithms were followed during the patient's care in the ED.  ____________________________________________   LABS (pertinent positives/negatives)  Labs Reviewed  CULTURE, BLOOD (ROUTINE X 2)  CULTURE, BLOOD (ROUTINE X 2)  LACTIC ACID, PLASMA  CBC WITH DIFFERENTIAL/PLATELET  COMPREHENSIVE METABOLIC PANEL  FIBRIN DERIVATIVES D-DIMER ([facility] ONLY)  PROCALCITONIN  LACTATE DEHYDROGENASE  FERRITIN  TRIGLYCERIDES  FIBRINOGEN  C-REACTIVE PROTEIN   EKG: Interpreted by me, sinus rhythm the rate of 78 bpm, left axis deviation, normal QT  RADIOLOGY Images were viewed by me  Chest x-ray  IMPRESSION:  1. Findings consistent with multifocal infection.  2. Follow-up is recommended to document clearing.  ____________________________________________   DIFFERENTIAL DIAGNOSIS   COVID-19, PE, pneumonia   FINAL ASSESSMENT AND PLAN  COVID-19, hypoxia   Plan: The patient had presented for COVID-19 and hypoxia. Patient's labs did not reveal any acute process. Patient's imaging was indicative of multifocal infection consistent with COVID-19.  Patient reports his wife is also covered positive.  He  will be transferred to the [facility] for hospital admission.   Laurence Aly, MD    Note: This note was generated in part or whole with voice recognition software. Voice recognition is usually quite accurate but there are transcription errors that can and very often do occur. I apologize for any typographical errors that were not detected and corrected.     Earleen Newport, MD 12/11/18 1242

## 2018-12-11 NOTE — ED Notes (Signed)
Pt taken off high flow oxygen for transfer.  Placed on 6 liters nasal cannula.  Oxygen sats 93%.

## 2018-12-11 NOTE — Assessment & Plan Note (Signed)
Chronic.  He is followed by cardiology.  He had a left heart catheterization earlier this year.

## 2018-12-11 NOTE — ED Triage Notes (Signed)
Pt states he has been dealing with covid for 2 weeks now, increasing shob, headache and decreased appetite. Sent here by Dr for further evaluation. NAD. Sats 86% on RA.

## 2018-12-11 NOTE — Assessment & Plan Note (Signed)
We will continue Toprol-XL and Cozaar. 

## 2018-12-11 NOTE — ED Notes (Signed)
Report called to [facility] icu nurse.

## 2018-12-11 NOTE — ED Notes (Signed)
Pt place on high flow oxygen due to low oxygen saturations.  RT at bedside.  md in with pt also.  Pt alert.  nsr on monitor.  Iv in place.

## 2018-12-11 NOTE — Subjective & Objective (Signed)
CC: fatigue, SOB, COVID-21 HPI: 53 year old male with a history of hypertension, OSA, BPH, aortic insufficiency presents as a transfer from [facility].  Patient was diagnosed with COVID on December 01, 2018.  Patient states that his son (who lives with the patient) attended a wedding on the weekend of November 27, 2018.  Patient son started having symptoms around the 19th.  He was diagnosed shortly thereafter with COVID.  Patient started having symptoms around 20 September.  He got tested on the 21st.  He was called by his PCP with his positive test.  Patient has been quarantining at home.  He has been having increasing shortness of breath, fatigue, anorexia.  Patient sent to the ER today due to worsening shortness of breath.  He is noted to be hypoxic with room air sats in the mid 80s.  He was started on oxygen.  Checks are demonstrated pneumonia.  Patient was given 125 mg of IV Solu-Medrol.  He was transferred to [facility] for further care.

## 2018-12-11 NOTE — ED Notes (Signed)
EMTALA COMPLETED, SIGNED CONSENT AND VS WITHIN TIME LIMIT

## 2018-12-11 NOTE — Assessment & Plan Note (Signed)
-  Continue Cardura 

## 2018-12-11 NOTE — ED Notes (Signed)
Resumed care from [place] c rn.  Pt alert.  Pt waiting on transfer to [facility] .  nsr on monitor.

## 2018-12-11 NOTE — Assessment & Plan Note (Signed)
Currently on 6 L oxygen.  Patient does not use supplemental oxygen at home.

## 2018-12-11 NOTE — Assessment & Plan Note (Signed)
Stable

## 2018-12-11 NOTE — ED Notes (Signed)
Pt waiting on ems transport.

## 2018-12-11 NOTE — ED Notes (Signed)
Pt called wife  

## 2018-12-11 NOTE — ED Notes (Signed)
Pt hypoxic, pale in color, working RR. Pt placed from 4-6L O2 without change. Pt placed on non-re breather until respiratory can place high flow.

## 2018-12-11 NOTE — Progress Notes (Signed)
Spoke with wife Venida Jarvis, answered all questions and concerns.

## 2018-12-11 NOTE — Progress Notes (Signed)
Pharmacy Brief Note   O:  ALT: 43  CXR: There are patchy infiltrates within the lungs, predominantly involving the LATERAL portion of the LEFT lung and LEFT lung base. Patchy somewhat opacities are identified in the [place] lobe.   SpO2: 91% on 6 L/min on HFNC    A/P:  Patient meets requirements for remdesivir therapy.  Will start  remdesivir 200 mg IV x 1  followed by 100 mg IV daily x 4 days.  Monitor ALT  Royetta Asal, PharmD, BCPS 12/11/2018 10:40 PM

## 2018-12-11 NOTE — ED Notes (Signed)
Patient 87% on room air. Placed on 3L [place] with increase to 92%. Will continue to monitor for hypoxia and SOB.

## 2018-12-11 NOTE — H&P (Signed)
History and Physical    Billy Herman [id number] DOB: February 05, 1966 DOA: 12/11/2018  PCP: [name], MD   Patient coming from: [facility] ED  I have personally briefly reviewed patient's old medical records in [place]  CC: fatigue, SOB, COVID-57 HPI: 53 year old male with a history of hypertension, OSA, BPH, aortic insufficiency presents as a transfer from [facility] regional hospital.  Patient was diagnosed with COVID on December 01, 2018.  Patient states that his son (who lives with the patient) attended a wedding on the weekend of November 27, 2018.  Patient son started having symptoms around the 19th.  He was diagnosed shortly thereafter with COVID.  Patient started having symptoms around 20 September.  He got tested on the 21st.  He was called by his PCP with his positive test.  Patient has been quarantining at home.  He has been having increasing shortness of breath, fatigue, anorexia.  Patient sent to the ER today due to worsening shortness of breath.  He is noted to be hypoxic with room air sats in the mid 80s.  He was started on oxygen.  Checks are demonstrated pneumonia.  Patient was given 125 mg of IV Solu-Medrol.  He was transferred to [facility] for further care.    ED Course: given IV Solumedrol 125 mg, obtained labs and CXR  Review of Systems:  Review of Systems  Constitutional: Positive for fever, malaise/fatigue and weight loss.  HENT: Negative.   Eyes: Negative.   Respiratory: Positive for cough, shortness of breath and wheezing.   Cardiovascular: Negative.   Gastrointestinal: Negative.   Genitourinary: Negative.   Musculoskeletal: Positive for myalgias.  Skin: Negative.   Neurological: Negative.   Endo/Heme/Allergies: Negative.   Psychiatric/Behavioral: Negative.   All other systems reviewed and are negative.   Past Medical History:  Diagnosis Date  . Hyperlipidemia   . Hypertension   . Low serum vitamin D   . Sleep apnea     Past  Surgical History:  Procedure Laterality Date  . APPENDECTOMY    . COLONOSCOPY WITH PROPOFOL N/A 12/01/2015   Procedure: COLONOSCOPY WITH PROPOFOL;  Surgeon: Lollie Sails, MD;  Location: [facility] ENDOSCOPY;  Service: Endoscopy;  Laterality: N/A;  . HERNIA REPAIR    . inguial hernia  Bilateral   . LEFT HEART CATH AND CORONARY ANGIOGRAPHY Left 09/25/2018   Procedure: LEFT HEART CATH AND CORONARY ANGIOGRAPHY;  Surgeon: Corey Skains, MD;  Location: [place] CV LAB;  Service: Cardiovascular;  Laterality: Left;     reports that he has quit smoking. He uses smokeless tobacco. He reports previous alcohol use. He reports previous drug use.  No Known Allergies  Family History  Problem Relation Age of Onset  . Lung cancer Mother   . Hypertension Mother   . Hyperlipidemia Mother   . Heart disease Father   . Hyperlipidemia Father   . Hypertension Father   . Heart attack Father   . Anuerysm Father     Prior to Admission medications   Medication Sig Start Date End Date Taking? Authorizing Provider  aspirin EC 81 MG tablet Take 81 mg by mouth every evening.     [provider]  doxazosin (CARDURA) 2 MG tablet Take 2 mg by mouth daily.    [provider]  ergocalciferol (VITAMIN D2) 50000 units capsule Take 50,000 Units by mouth once a week. Fridays    [provider]  fenofibrate micronized (LOFIBRA) 134 MG capsule Take 134 mg by mouth  every evening.  09/19/16   [provider]  ferrous sulfate 325 (65 FE) MG tablet Take 325 mg by mouth every evening.     [provider]  losartan (COZAAR) 100 MG tablet Take 100 mg by mouth daily.    [provider]  metoprolol succinate (TOPROL-XL) 25 MG 24 hr tablet Take 25 mg by mouth every evening.    [provider]  ondansetron (ZOFRAN) 4 MG tablet Take 4 mg by mouth 3 (three) times daily. 12/08/18 12/18/18  [provider]  rosuvastatin (CRESTOR) 10 MG tablet Take 10 mg by mouth  every evening.  01/07/18   [provider]  traMADol (ULTRAM) 50 MG tablet Take 50 mg by mouth every 8 (eight) hours as needed for pain. 12/08/18 12/18/18  [provider]    Physical Exam: Vitals:   12/11/18 2200  BP: 132/79  Pulse: 74  Resp: 16  Temp: 97.6 F (36.4 C)  TempSrc: Oral  SpO2: 91%    Physical Exam  Nursing note and vitals reviewed. Constitutional: He appears well-developed and well-nourished. No distress.  HENT:  Head: Normocephalic and atraumatic.  Eyes: Pupils are equal, round, and reactive to light. Right eye exhibits no discharge. Left eye exhibits no discharge.  Neck: No JVD present.  Cardiovascular: Normal rate and regular rhythm.  Murmur heard. Respiratory: No respiratory distress. He has no wheezes. He has rales in the right lower field and the left lower field.  GI: Soft. Bowel sounds are normal. He exhibits no distension. There is no abdominal tenderness.  Musculoskeletal: Normal range of motion.        General: No edema.  Lymphadenopathy:    He has no cervical adenopathy.  Neurological: He is alert. He has normal reflexes.  Skin: Skin is warm and dry.  Psychiatric: He has a normal mood and affect. His behavior is normal. Judgment and thought content normal.     Labs on Admission: I have personally reviewed following labs and imaging studies  CBC: Recent Labs  Lab 12/11/18 1142  WBC 5.6  NEUTROABS 4.2  HGB 13.1  HCT 39.5  MCV 90.4  PLT [id number]   Basic Metabolic Panel: Recent Labs  Lab 12/11/18 1142  NA 142  K 3.8  CL 104  CO2 23  GLUCOSE 120*  BUN 21*  CREATININE 0.91  CALCIUM 8.9   GFR: Estimated Creatinine Clearance: 126 mL/min (by C-G formula based on SCr of 0.91 mg/dL). Liver Function Tests: Recent Labs  Lab 12/11/18 1142  AST 69*  ALT 43  ALKPHOS 82  BILITOT 1.7*  PROT 8.0  ALBUMIN 4.1   No results for input(s): LIPASE, AMYLASE in the last 168 hours. No results for input(s): AMMONIA in the last 168  hours. Coagulation Profile: No results for input(s): INR, PROTIME in the last 168 hours. Cardiac Enzymes: No results for input(s): CKTOTAL, CKMB, CKMBINDEX, TROPONINI in the last 168 hours. BNP (last 3 results) No results for input(s): PROBNP in the last 8760 hours. HbA1C: No results for input(s): HGBA1C in the last 72 hours. CBG: No results for input(s): GLUCAP in the last 168 hours. Lipid Profile: Recent Labs    12/11/18 1142  TRIG 240*   Thyroid Function Tests: No results for input(s): TSH, T4TOTAL, FREET4, T3FREE, THYROIDAB in the last 72 hours. Anemia Panel: Recent Labs    12/11/18 1142  FERRITIN 2,479*   Urine analysis:    Component Value Date/Time   COLORURINE AMBER (A) 12/11/2018 1940   APPEARANCEUR HAZY (  A) 12/11/2018 1940   APPEARANCEUR Clear 09/09/2018 0744   LABSPEC 1.026 12/11/2018 1940   PHURINE 5.0 12/11/2018 1940   GLUCOSEU 50 (A) 12/11/2018 [place] NEGATIVE 12/11/2018 1940   BILIRUBINUR SMALL (A) 12/11/2018 1940   BILIRUBINUR Negative 09/09/2018 [place] 12/11/2018 1940   PROTEINUR 30 (A) 12/11/2018 1940   NITRITE NEGATIVE 12/11/2018 1940   LEUKOCYTESUR NEGATIVE 12/11/2018 1940   Lab Results - documented in this encounter  SARS-COV-2 RNA, QL, RT PCR (COVID-19) (12/01/2018 9:39 AM EDT) SARS-COV-2 RNA, QL, RT PCR (COVID-19) (12/01/2018 9:39 AM EDT)  Component Value Ref Range Performed At Pathologist Signature  [facility] COVID-19 Nasopharynx Positive for 2019-nCoV (A) Comment:  NOTE: The COVID-19 assay is under Emergency Use Authorization (EUA) by the U.S. Food and Drug Administration. [facility] and [facility] are designated as high complexity laboratories by the Clinical Laboratory Improvement Amendments of 1988(CLIA) and are qualified to perform this test. ASSAY INFORMATION: Real Time RT-PCR  Not Detected [facility] [employer] on Admission: I have personally  reviewed images Dg Chest Port 1 View  Result Date: 12/11/2018 CLINICAL DATA:  Pt states he has been dealing with covid for 2 weeks now, increasing SHOB, headache and decreased appetite. Sent here by Dr for further evaluation. NAD. Sats 86% on RA. EXAM: PORTABLE CHEST 1 VIEW COMPARISON:  CT 05/19/2018 FINDINGS: The heart size is normal. There are patchy infiltrates within the lungs, predominantly involving the LATERAL portion of the LEFT lung and LEFT lung base. Patchy somewhat opacities are identified in the [place] lobe. No pleural effusions. No evidence for pulmonary edema. Visualized portion of the abdomen is unremarkable. IMPRESSION: 1. Findings consistent with multifocal infection. 2. Follow-up is recommended to document clearing. Electronically Signed   By: Nolon Nations M.D.   On: 12/11/2018 11:57    EKG: I have personally reviewed EKG: NSR   Assessment/Plan Principal Problem:   Pneumonia due to COVID-19 virus Active Problems:   Benign essential HTN   Acute respiratory failure due to COVID-19 [facility])   Ascending aorta dilation (HCC)   Moderate aortic valve insufficiency   OSA (obstructive sleep apnea)   Benign prostatic hyperplasia with weak urinary stream    Pneumonia due to COVID-19 virus Admit to medical bed.  Start IV remdesivir.  Patient given 125 mg of IV Solu-Medrol.  Will start oral Decadron 4 mg daily tomorrow.  PRN Combivent Respimat inhaler.  RT for assessment.  Discussed with the patient if his shortness of breath worsenings, he may be a candidate for either convalescent plasma and/or Actemra.  Moderate aortic valve insufficiency Chronic.  He is followed by cardiology.  He had a left heart catheterization earlier this year.  OSA (obstructive sleep apnea) Stable.  Benign prostatic hyperplasia with weak urinary stream Continue Cardura.  Acute respiratory failure due to COVID-19 [facility]) Currently on 6 L oxygen.  Patient does not use supplemental oxygen at  home.  Benign essential HTN Continue Toprol-XL and Cozaar.  Ascending aorta dilation (HCC) Stable.  Followed by cardiology.   DVT prophylaxis: Lovenox Code Status: Full Code Family Communication: none  Disposition Plan: DC to home when weaned off supplemental O2 and has completed IV remdesivir  Consults called: none  Admission status: Inpatient, Med-Surg   Kristopher Oppenheim, DO [facility] 12/11/2018, 10:39 PM

## 2018-12-12 ENCOUNTER — Encounter (HOSPITAL_COMMUNITY): Payer: Self-pay

## 2018-12-12 ENCOUNTER — Inpatient Hospital Stay (HOSPITAL_COMMUNITY): Payer: BC Managed Care – PPO

## 2018-12-12 DIAGNOSIS — G4733 Obstructive sleep apnea (adult) (pediatric): Secondary | ICD-10-CM

## 2018-12-12 DIAGNOSIS — J1289 Other viral pneumonia: Secondary | ICD-10-CM

## 2018-12-12 DIAGNOSIS — I10 Essential (primary) hypertension: Secondary | ICD-10-CM

## 2018-12-12 DIAGNOSIS — R739 Hyperglycemia, unspecified: Secondary | ICD-10-CM

## 2018-12-12 DIAGNOSIS — J9600 Acute respiratory failure, unspecified whether with hypoxia or hypercapnia: Secondary | ICD-10-CM

## 2018-12-12 DIAGNOSIS — U071 COVID-19: Principal | ICD-10-CM

## 2018-12-12 LAB — COMPREHENSIVE METABOLIC PANEL
ALT: 43 U/L (ref 0–44)
AST: 56 U/L — ABNORMAL HIGH (ref 15–41)
Albumin: 3.8 g/dL (ref 3.5–5.0)
Alkaline Phosphatase: 89 U/L (ref 38–126)
Anion gap: 12 (ref 5–15)
BUN: 24 mg/dL — ABNORMAL HIGH (ref 6–20)
CO2: 24 mmol/L (ref 22–32)
Calcium: 9.2 mg/dL (ref 8.9–10.3)
Chloride: 107 mmol/L (ref 98–111)
Creatinine, Ser: 0.95 mg/dL (ref 0.61–1.24)
GFR calc Af Amer: >60 mL/min (ref >60)
GFR calc non Af Amer: >60 mL/min (ref >60)
Glucose, Bld: 168 mg/dL — ABNORMAL HIGH (ref 70–99)
Potassium: 3.9 mmol/L (ref 3.5–5.1)
Sodium: 143 mmol/L (ref 135–145)
Total Bilirubin: 1.4 mg/dL — ABNORMAL HIGH (ref 0.3–1.2)
Total Protein: 7.5 g/dL (ref 6.5–8.1)

## 2018-12-12 LAB — CBC WITH DIFFERENTIAL/PLATELET
Abs Immature Granulocytes: 0.03 10*3/uL (ref 0.00–0.07)
Basophils Absolute: 0 10*3/uL (ref 0.0–0.1)
Basophils Relative: 0 %
Eosinophils Absolute: 0 10*3/uL (ref 0.0–0.5)
Eosinophils Relative: 0 %
HCT: 39.7 % (ref 39.0–52.0)
Hemoglobin: 12.8 g/dL — ABNORMAL LOW (ref 13.0–17.0)
Immature Granulocytes: 1 %
Lymphocytes Relative: 16 %
Lymphs Abs: 0.5 10*3/uL — ABNORMAL LOW (ref 0.7–4.0)
MCH: 29.6 pg (ref 26.0–34.0)
MCHC: 32.2 g/dL (ref 30.0–36.0)
MCV: 91.9 fL (ref 80.0–100.0)
Monocytes Absolute: 0.2 10*3/uL (ref 0.1–1.0)
Monocytes Relative: 7 %
Neutro Abs: 2.2 10*3/uL (ref 1.7–7.7)
Neutrophils Relative %: 76 %
Platelets: 346 10*3/uL (ref 150–400)
RBC: 4.32 MIL/uL (ref 4.22–5.81)
RDW: 13 % (ref 11.5–15.5)
WBC: 2.9 10*3/uL — ABNORMAL LOW (ref 4.0–10.5)
nRBC: 0 % (ref 0.0–0.2)

## 2018-12-12 LAB — GLUCOSE, CAPILLARY
Glucose-Capillary: 159 mg/dL — ABNORMAL HIGH (ref 70–99)
Glucose-Capillary: 148 mg/dL — ABNORMAL HIGH (ref 70–99)
Glucose-Capillary: 193 mg/dL — ABNORMAL HIGH (ref 70–99)

## 2018-12-12 LAB — MAGNESIUM: Magnesium: 2.3 mg/dL (ref 1.7–2.4)

## 2018-12-12 LAB — C-REACTIVE PROTEIN: CRP: 1.9 mg/dL — ABNORMAL HIGH (ref <1.0)

## 2018-12-12 LAB — ABO/RH: ABO/RH(D): O POS

## 2018-12-12 LAB — HIV ANTIBODY (ROUTINE TESTING W REFLEX): HIV Screen 4th Generation wRfx: NONREACTIVE

## 2018-12-12 LAB — D-DIMER, QUANTITATIVE: D-Dimer, Quant: 0.45 ug/mL-FEU (ref 0.00–0.50)

## 2018-12-12 LAB — FERRITIN: Ferritin: 2635 ng/mL — ABNORMAL HIGH (ref 24–336)

## 2018-12-12 MED ORDER — ENSURE ENLIVE PO LIQD
237.0000 mL | Freq: Two times a day (BID) | ORAL | Status: DC
  Administered 2018-12-12: 237 mL via ORAL

## 2018-12-12 MED ORDER — DEXAMETHASONE SODIUM PHOSPHATE 10 MG/ML IJ SOLN
6.0000 mg | Freq: Two times a day (BID) | INTRAMUSCULAR | Status: DC
  Administered 2018-12-12 – 2018-12-15 (×6): 6 mg via INTRAVENOUS
  Filled 2018-12-12 (×6): qty 1

## 2018-12-12 MED ORDER — ENOXAPARIN SODIUM 60 MG/0.6ML ~~LOC~~ SOLN
60.0000 mg | Freq: Every day | SUBCUTANEOUS | Status: DC
  Administered 2018-12-12 – 2018-12-14 (×3): 60 mg via SUBCUTANEOUS
  Filled 2018-12-12 (×3): qty 0.6

## 2018-12-12 MED ORDER — INFLUENZA VAC SPLIT QUAD 0.5 ML IM SUSY
0.5000 mL | PREFILLED_SYRINGE | INTRAMUSCULAR | Status: DC
  Filled 2018-12-12: qty 0.5

## 2018-12-12 MED ORDER — INFLUENZA VAC SPLIT QUAD 0.5 ML IM SUSY: 0.5000 mL | PREFILLED_SYRINGE | INTRAMUSCULAR | Status: DC

## 2018-12-12 MED ORDER — SODIUM CHLORIDE 0.9% FLUSH
3.0000 mL | Freq: Two times a day (BID) | INTRAVENOUS | Status: DC
  Administered 2018-12-12 – 2018-12-15 (×6): 3 mL via INTRAVENOUS

## 2018-12-12 MED ORDER — VITAMIN C 500 MG PO TABS
500.0000 mg | ORAL_TABLET | Freq: Every day | ORAL | Status: DC
  Administered 2018-12-12 – 2018-12-13 (×2): 500 mg via ORAL
  Filled 2018-12-12 (×2): qty 1

## 2018-12-12 MED ORDER — ZINC SULFATE 220 (50 ZN) MG PO CAPS
220.0000 mg | ORAL_CAPSULE | Freq: Every day | ORAL | Status: DC
  Administered 2018-12-12 – 2018-12-13 (×2): 220 mg via ORAL
  Filled 2018-12-12 (×2): qty 1

## 2018-12-12 MED ORDER — FAMOTIDINE 20 MG PO TABS
20.0000 mg | ORAL_TABLET | Freq: Every day | ORAL | Status: DC
  Administered 2018-12-12 – 2018-12-15 (×4): 20 mg via ORAL
  Filled 2018-12-12 (×4): qty 1

## 2018-12-12 MED ORDER — INSULIN ASPART 100 UNIT/ML ~~LOC~~ SOLN
0.0000 [IU] | Freq: Three times a day (TID) | SUBCUTANEOUS | Status: DC
  Administered 2018-12-12: 12:00:00 3 [IU] via SUBCUTANEOUS
  Administered 2018-12-12 – 2018-12-14 (×7): 2 [IU] via SUBCUTANEOUS

## 2018-12-12 NOTE — Progress Notes (Signed)
Initial Nutrition Assessment  DOCUMENTATION CODES:   Obesity unspecified  INTERVENTION:    Ensure Enlive po BID, each supplement provides 350 kcal and 20 grams of protein   Pt receiving Hormel Shake daily with Breakfast which provides 520 kcals and 22 g of protein and Magic cup BID with lunch and dinner, each supplement provides 290 kcal and 9 grams of protein, automatically on meal trays to optimize nutritional intake.   NUTRITION DIAGNOSIS:   Increased nutrient needs related to acute illness(COVID) as evidenced by estimated needs.  GOAL:   Patient will meet greater than or equal to 90% of their needs  MONITOR:   PO intake, Supplement acceptance, Labs  REASON FOR ASSESSMENT:   Malnutrition Screening Tool    ASSESSMENT:   53 yo male admitted with fatigue, SOB, COVID-19. PMH includes HLD, sleep apnea, HTN, vitamin D deficiency.   Patient with poor appetite and decreased intake since COVID diagnosis on 9/21. Per review of weight encounters, weight has been stable for greater than the past 2 years.  Labs reviewed.  Vitamin D, 25-Hydroxy 43 WNL on 09/09/2018 CBG's: 159  Medications reviewed and include decadron, novolog, vitamin C, and zinc sulfate.   NUTRITION - FOCUSED PHYSICAL EXAM:  deferred  Diet Order:   Diet Order            Diet Heart Room service appropriate? Yes; Fluid consistency: Thin  Diet effective now              EDUCATION NEEDS:   Not appropriate for education at this time  Skin:  Skin Assessment: Reviewed RN Assessment  Last BM:  9/30  Height:   Ht Readings from Last 1 Encounters:  12/11/18 6\' 1"  (1.854 m)    Weight:   Wt Readings from Last 1 Encounters:  12/11/18 121.1 kg    Ideal Body Weight:  83.6 kg  BMI:  Body mass index is 35.23 kg/m.  Estimated Nutritional Needs:   Kcal:  2200-2400  Protein:  120-135 gm  Fluid:  >/= 2.2 L    Molli Barrows, RD, LDN, [place] Pager [phone] After Hours Pager [phone]

## 2018-12-12 NOTE — Progress Notes (Addendum)
PROGRESS NOTE  Billy Herman [id number] DOB: 11/27/1965 DOA: 12/11/2018  PCP: [name], MD  Brief History/Interval Summary: 53 year old male with a history of hypertension, OSA, BPH, aortic insufficiency presented to the emergency department at [facility] with worsening shortness of breath.  He was diagnosed with COVID-19 on September 21.  Patient was noted to be hypoxic.  He was hospitalized for further management.  He was transferred here to [facility].    Reason for Visit: Acute respiratory failure with hypoxia.  Pneumonia due to COVID-19.  Consultants: None  Procedures: None  Antibiotics: Anti-infectives (From admission, onward)   Start     Dose/Rate Route Frequency Ordered Stop   12/12/18 1600  remdesivir 100 mg in sodium chloride 0.9 % 250 mL IVPB     100 mg 500 mL/hr over 30 Minutes Intravenous Every 24 hours 12/11/18 2243 12/16/18 1559   12/11/18 2330  remdesivir 200 mg in sodium chloride 0.9 % 250 mL IVPB     200 mg 500 mL/hr over 30 Minutes Intravenous Once 12/11/18 2243 12/12/18 0022      Subjective/Interval History: Patient states that he still short of breath.  Slightly better compared to yesterday.  Occasional cough.  No chest pain.  Some nausea but no vomiting.  No diarrhea.     Assessment/Plan:  Acute Hypoxic Resp. Failure due to Acute Covid 19 Viral Illness  COVID-19 Labs  Recent Labs    12/11/18 1142 12/11/18 1146 12/12/18 0054  DDIMER  --   --  0.45  FERRITIN 2,479*  --  2,635*  LDH 330*  --   --   CRP  --  1.8* 1.9*   COVID-19 test was positive on September 21.  Results under care everywhere.  Fever: Afebrile Oxygen requirements: High flow nasal cannula at 8 L/min.  Saturating in the early 90s. Antibacterials: None Remdesivir: Day 2 today Steroids: On dexamethasone 6 mg every day.  Will increase to twice a day. Diuretics: Not on a regular basis Actemra: Not given yet Vitamin C and Zinc: Will order DVT Prophylaxis:   Lovenox 40 mg once daily  Patient is tenuous from a respiratory standpoint.  He is requiring 8 L of oxygen by high flow nasal cannula.  He does not have increased work of breathing at this time.  He is on steroids and Remdesivir.  We will increase the dose of steroids.  CRP is 1.9.  D-dimer is normal.  Ferritin noted to be elevated.  Continue to trend these labs.  Encouraged prone positioning while he is in bed.  Incentive spirometry and mobilization as much as possible.  If patient continues to get worse then will consider Actemra and or convalescent plasma.  These were discussed briefly with the patient as below.  The treatment plan and use of medications and known side effects were discussed with patient. Some of the medications used are based on case reports/anecdotal data.  Steroids have shown some benefit in treatment of COVID-19 based on at least one study.  Another medication used is Actemra (Tocilizumab). However randomized control trials involving this drug have not shown any benefit although the final reports have not been published yet.  Complete risks and long-term side effects are unknown, however in the best clinical judgment they seem to be of some benefit.  Despite lack of benefit noted on preliminary reports from RCT's patient/family does want this medication knowing that this is considered experimental treatment.  Convalescent plasma was also discussed.  He understands that he  will have to consent for these medications.  Hyperglycemia No history of diabetes.  Elevated glucose levels most likely due to steroids.  Check HbA1c tomorrow.  Mild transaminitis Most likely secondary to COVID-19.  Continue to monitor.  Leukopenia Secondary to COVID-19.  History of moderate aortic valve insufficiency and ascending aorta dilatation Chronic issues followed by cardiology.  Continue to monitor.  History of obstructive sleep apnea He uses CPAP at home.  Currently on high flow nasal  cannula.  History of BPH Continue Cardura  History of benign essential hypertension Continue home medications.  Morbid obesity Estimated body mass index is 35.23 kg/m as calculated from the following:   Height as of this encounter: 6\' 1"  (1.854 m).   Weight as of this encounter: 121.1 kg.   DVT Prophylaxis: Lovenox PUD Prophylaxis: Initiate Pepcid Code Status: Full code Family Communication: Discussed with the patient.  Will call his wife. Disposition Plan: Remain as outlined above.  Mobilize as much as tolerated.  Hopefully he will improve and be able to go home.   Medications:  Scheduled:  aspirin EC  81 mg Oral QPM   dexamethasone (DECADRON) injection  6 mg Intravenous Daily   doxazosin  2 mg Oral Daily   enoxaparin (LOVENOX) injection  40 mg Subcutaneous QHS   fenofibrate  160 mg Oral Daily   [START ON 12/16/2018] influenza vac split quadrivalent PF  0.5 mL Intramuscular Tomorrow-1000   Ipratropium-Albuterol  1 puff Inhalation Q6H   losartan  100 mg Oral Daily   metoprolol succinate  25 mg Oral QPM   rosuvastatin  10 mg Oral QPM   Continuous:  sodium chloride     remdesivir 100 mg in NS 250 mL     [id number] chloride, acetaminophen, guaiFENesin-dextromethorphan, sodium chloride flush, traMADol   Objective:  Vital Signs  Vitals:   12/11/18 2200 12/12/18 0000 12/12/18 0400 12/12/18 0724  BP: 132/79 (!) 103/58 115/71 122/78  Pulse: 74 60 (!) 58 60  Resp: 16 (!) 22 18 19   Temp: 97.6 F (36.4 C) 97.9 F (36.6 C) 98.6 F (37 C) (!) 97.5 F (36.4 C)  TempSrc: Oral Oral Oral Oral  SpO2: 91% 95% (!) 89% 92%  Weight: 121.1 kg     Height: 6\' 1"  (1.854 m)      No intake or output data in the 24 hours ending 12/12/18 0937 Filed Weights   12/11/18 2200  Weight: 121.1 kg    General appearance: Awake alert.  In no distress.  Morbidly obese Resp: Coarse breath sounds bilaterally.  Few crackles noted.  No wheezing or rhonchi.  Tachypneic at rest.    Cardio: S1-S2 is normal regular.  No S3-S4.  No murmur appreciated but exam limited due to body habitus. GI: Abdomen is soft.  Nontender nondistended.  Bowel sounds are present normal.  No masses organomegaly Extremities: No edema.  Full range of motion of lower extremities. Neurologic: Alert and oriented x3.  No focal neurological deficits.    Lab Results:  Data Reviewed: I have personally reviewed following labs and imaging studies  CBC: Recent Labs  Lab 12/11/18 1142 12/12/18 0054  WBC 5.6 2.9*  NEUTROABS 4.2 2.2  HGB 13.1 12.8*  HCT 39.5 39.7  MCV 90.4 91.9  PLT 284 [id number]    Basic Metabolic Panel: Recent Labs  Lab 12/11/18 1142 12/12/18 0054  NA 142 143  K 3.8 3.9  CL 104 107  CO2 23 24  GLUCOSE 120* 168*  BUN 21* 24*  CREATININE 0.91  0.95  CALCIUM 8.9 9.2  MG  --  2.3    GFR: Estimated Creatinine Clearance: 121.2 mL/min (by C-G formula based on SCr of 0.95 mg/dL).  Liver Function Tests: Recent Labs  Lab 12/11/18 1142 12/12/18 0054  AST 69* 56*  ALT 43 43  ALKPHOS 82 89  BILITOT 1.7* 1.4*  PROT 8.0 7.5  ALBUMIN 4.1 3.8    Lipid Profile: Recent Labs    12/11/18 1142  TRIG 240*     Anemia Panel: Recent Labs    12/11/18 1142 12/12/18 0054  FERRITIN 2,479* 2,635*    Recent Results (from the past 240 hour(s))  Blood Culture (routine x 2)     Status: None (Preliminary result)   Collection Time: 12/11/18 11:42 AM   Specimen: BLOOD  Result Value Ref Range Status   Specimen Description BLOOD BLOOD LEFT HAND  Final   Special Requests   Final    BOTTLES DRAWN AEROBIC AND ANAEROBIC Blood Culture results may not be optimal due to an excessive volume of blood received in culture bottles   Culture   Final    NO GROWTH < 24 HOURS Performed at [facility], [address] [place], [place] [address]    Report Status PENDING  Incomplete  Blood Culture (routine x 2)     Status: None (Preliminary result)   Collection Time: 12/11/18 12:02  PM   Specimen: BLOOD  Result Value Ref Range Status   Specimen Description BLOOD BLOOD LEFT FOREARM  Final   Special Requests   Final    BOTTLES DRAWN AEROBIC AND ANAEROBIC Blood Culture adequate volume   Culture   Final    NO GROWTH < 24 HOURS Performed at [facility], [address], [place], [place] [address]    Report Status PENDING  Incomplete      Radiology Studies: Portable Chest 1 View  Result Date: 12/12/2018 CLINICAL DATA:  Shortness of breath EXAM: PORTABLE CHEST 1 VIEW COMPARISON:  12/11/2018 FINDINGS: Cardiac shadow is enlarged but stable. The lungs are hypoinflated. Mild patchy parenchymal infiltrates are seen bilaterally but slightly improved when compared with the prior study. No other focal abnormality is noted. IMPRESSION: Slight improvement in the degree of bilateral parenchymal opacities. Electronically Signed   By: Inez Catalina M.D.   On: 12/12/2018 08:26   Dg Chest Port 1 View  Result Date: 12/11/2018 CLINICAL DATA:  Pt states he has been dealing with covid for 2 weeks now, increasing SHOB, headache and decreased appetite. Sent here by Dr for further evaluation. NAD. Sats 86% on RA. EXAM: PORTABLE CHEST 1 VIEW COMPARISON:  CT 05/19/2018 FINDINGS: The heart size is normal. There are patchy infiltrates within the lungs, predominantly involving the LATERAL portion of the LEFT lung and LEFT lung base. Patchy somewhat opacities are identified in the [place] lobe. No pleural effusions. No evidence for pulmonary edema. Visualized portion of the abdomen is unremarkable. IMPRESSION: 1. Findings consistent with multifocal infection. 2. Follow-up is recommended to document clearing. Electronically Signed   By: Nolon Nations M.D.   On: 12/11/2018 11:57       LOS: 1 day   [facility] Pager on [email or web address]  12/12/2018, 9:37 AM

## 2018-12-12 NOTE — TOC Initial Note (Signed)
Transition of Care [facility]) - Initial/Assessment Note    Patient Details  Name: Billy Herman MRN: [id number] Date of Birth: 1965/10/21  Transition of [facility]) CM/SW Contact:    Ninfa Meeker, RN Phone Number: [phone] (working remotely) 12/12/2018, 12:18 PM  Clinical Narrative:   53 yr old male transferred from [facility] for treatment of COVID 19. Patient from home with wife. On Remdesivir, IV steroids, oxygen at [facility]. Case manager will continue to monitor for needs as he medically improves. May he be blessed to do so.                  Barriers to Discharge: Continued Medical Work up   Patient Goals and CMS Choice        Expected Discharge Plan and Services                                                Prior Living Arrangements/Services   Lives with:: Spouse                   Activities of Daily Living Home Assistive Devices/Equipment: None ADL Screening (condition at time of admission) Patient's cognitive ability adequate to safely complete daily activities?: Yes Is the patient deaf or have difficulty hearing?: No Does the patient have difficulty seeing, even when wearing glasses/contacts?: No Does the patient have difficulty concentrating, remembering, or making decisions?: No Patient able to express need for assistance with ADLs?: Yes Does the patient have difficulty dressing or bathing?: No Independently performs ADLs?: Yes (appropriate for developmental age) Does the patient have difficulty walking or climbing stairs?: No Weakness of Legs: Both Weakness of Arms/Hands: Both  Permission Sought/Granted                  Emotional Assessment              Admission diagnosis:  COVID 19 VIRUS INFECTION Patient Active Problem List   Diagnosis Date Noted  . Pneumonia due to COVID-19 virus 12/11/2018  . Acute respiratory failure due to COVID-19 ([place]) 12/11/2018  . Benign prostatic hyperplasia with weak urinary stream 03/06/2017   . Ascending aorta dilation (HCC) 10/22/2016  . Moderate aortic valve insufficiency 10/22/2016  . Ascending aorta dilation (HCC) 10/22/2016  . OSA (obstructive sleep apnea) 09/19/2016  . SOBOE (shortness of breath on exertion) 09/19/2016  . Benign essential HTN 07/26/2016  . Low serum vitamin D 07/26/2016  . Tobacco user 09/05/2015  . Hypertriglyceridemia 02/22/2015  . Low HDL (under 40) 02/22/2015   PCP:  [name], MD Pharmacy:   [id number] - [place], [place] - [address] [place] [address] Phone: [phone] Fax: [phone]     Social Determinants of Health (SDOH) Interventions    Readmission Risk Interventions No flowsheet data found.

## 2018-12-12 NOTE — Progress Notes (Signed)
Called and updated patient's wife, Venida Jarvis.  All questions and concerns addressed.  Earleen Reaper RN

## 2018-12-12 NOTE — Progress Notes (Signed)
Patient's spouse updated by telephone.  All questions welcomed and answered.

## 2018-12-13 DIAGNOSIS — N179 Acute kidney failure, unspecified: Secondary | ICD-10-CM

## 2018-12-13 LAB — CBC WITH DIFFERENTIAL/PLATELET
Abs Immature Granulocytes: 0.04 10*3/uL (ref 0.00–0.07)
Basophils Absolute: 0 10*3/uL (ref 0.0–0.1)
Basophils Relative: 0 %
Eosinophils Absolute: 0 10*3/uL (ref 0.0–0.5)
Eosinophils Relative: 0 %
HCT: 38.1 % — ABNORMAL LOW (ref 39.0–52.0)
Hemoglobin: 12.3 g/dL — ABNORMAL LOW (ref 13.0–17.0)
Immature Granulocytes: 1 %
Lymphocytes Relative: 10 %
Lymphs Abs: 0.6 10*3/uL — ABNORMAL LOW (ref 0.7–4.0)
MCH: 29.8 pg (ref 26.0–34.0)
MCHC: 32.3 g/dL (ref 30.0–36.0)
MCV: 92.3 fL (ref 80.0–100.0)
Monocytes Absolute: 0.6 10*3/uL (ref 0.1–1.0)
Monocytes Relative: 10 %
Neutro Abs: 4.4 10*3/uL (ref 1.7–7.7)
Neutrophils Relative %: 79 %
Platelets: 414 10*3/uL — ABNORMAL HIGH (ref 150–400)
RBC: 4.13 MIL/uL — ABNORMAL LOW (ref 4.22–5.81)
RDW: 13.1 % (ref 11.5–15.5)
WBC: 5.7 10*3/uL (ref 4.0–10.5)
nRBC: 0 % (ref 0.0–0.2)

## 2018-12-13 LAB — HEMOGLOBIN A1C
Hgb A1c MFr Bld: 6.2 % — ABNORMAL HIGH (ref 4.8–5.6)
Mean Plasma Glucose: 131.24 mg/dL

## 2018-12-13 LAB — COMPREHENSIVE METABOLIC PANEL
ALT: 63 U/L — ABNORMAL HIGH (ref 0–44)
AST: 83 U/L — ABNORMAL HIGH (ref 15–41)
Albumin: 3.9 g/dL (ref 3.5–5.0)
Alkaline Phosphatase: 88 U/L (ref 38–126)
Anion gap: 11 (ref 5–15)
BUN: 59 mg/dL — ABNORMAL HIGH (ref 6–20)
CO2: 25 mmol/L (ref 22–32)
Calcium: 9.3 mg/dL (ref 8.9–10.3)
Chloride: 106 mmol/L (ref 98–111)
Creatinine, Ser: 2.15 mg/dL — ABNORMAL HIGH (ref 0.61–1.24)
GFR calc Af Amer: 39 mL/min — ABNORMAL LOW (ref >60)
GFR calc non Af Amer: 34 mL/min — ABNORMAL LOW (ref >60)
Glucose, Bld: 154 mg/dL — ABNORMAL HIGH (ref 70–99)
Potassium: 4.2 mmol/L (ref 3.5–5.1)
Sodium: 142 mmol/L (ref 135–145)
Total Bilirubin: 1 mg/dL (ref 0.3–1.2)
Total Protein: 7.2 g/dL (ref 6.5–8.1)

## 2018-12-13 LAB — GLUCOSE, CAPILLARY
Glucose-Capillary: 150 mg/dL — ABNORMAL HIGH (ref 70–99)
Glucose-Capillary: 149 mg/dL — ABNORMAL HIGH (ref 70–99)
Glucose-Capillary: 143 mg/dL — ABNORMAL HIGH (ref 70–99)

## 2018-12-13 LAB — FERRITIN: Ferritin: 2074 ng/mL — ABNORMAL HIGH (ref 24–336)

## 2018-12-13 LAB — C-REACTIVE PROTEIN: CRP: 1.2 mg/dL — ABNORMAL HIGH (ref <1.0)

## 2018-12-13 LAB — MAGNESIUM: Magnesium: 2.8 mg/dL — ABNORMAL HIGH (ref 1.7–2.4)

## 2018-12-13 LAB — D-DIMER, QUANTITATIVE: D-Dimer, Quant: 0.45 ug/mL-FEU (ref 0.00–0.50)

## 2018-12-13 MED ORDER — SODIUM CHLORIDE 0.9 % IV BOLUS
500.0000 mL | Freq: Once | INTRAVENOUS | Status: AC
  Administered 2018-12-13: 10:00:00 500 mL via INTRAVENOUS

## 2018-12-13 MED ORDER — SODIUM CHLORIDE 0.9 % IV SOLN
INTRAVENOUS | Status: DC
  Administered 2018-12-13 – 2018-12-15 (×5): via INTRAVENOUS

## 2018-12-13 MED ORDER — DOXAZOSIN MESYLATE 2 MG PO TABS
2.0000 mg | ORAL_TABLET | Freq: Every day | ORAL | Status: DC
  Administered 2018-12-14 – 2018-12-15 (×2): 2 mg via ORAL
  Filled 2018-12-13 (×4): qty 1

## 2018-12-13 MED ORDER — SODIUM CHLORIDE 0.9 % IV BOLUS
500.0000 mL | Freq: Once | INTRAVENOUS | Status: AC
  Administered 2018-12-13: 13:00:00 500 mL via INTRAVENOUS

## 2018-12-13 NOTE — Progress Notes (Signed)
   12/13/18 0525  Vitals  Pulse Rate (!) 48  ECG Heart Rate (!) 48  Resp 15  Oxygen Therapy  SpO2 94 %  O2 Device Nasal Cannula  O2 Flow Rate (L/min) 4 L/min  Patient Activity (if Appropriate) In bed  MEWS Score  MEWS RR 0  MEWS Pulse 1  MEWS Systolic 1  MEWS LOC 0  MEWS Temp 0  MEWS Score 2  MEWS Score Color Yellow  MEWS Assessment  Is this an acute change? Yes  MEWS guidelines implemented *See [place]  Provider Notification  Provider Name/Title Dr. Bridgett Larsson MD  Date Provider Notified 12/13/18  Time Provider Notified [phone]  Notification Type Page  Notification Reason Change in status  Response No new orders  Date of Provider Response 12/13/18   Blood Pressure 91/54.  Heart Rate ranging between 45 - 57 BPM.  R-18, 95% on 4 LPM [place].  Upon assessment, patient is easily awakened.  He has no complaints of dizziness, weakness, or CP.  He is A & O x 4.  He received metoprolol at 1700 on 12/12/2018.  Dr. Bridgett Larsson made aware of the above.  No additional orders received.  Will follow protocol in relations of MEWS score of Yellow.  Will continue to monitor patient.  Earleen Reaper RN

## 2018-12-13 NOTE — Progress Notes (Signed)
Called patient's wife, Venida Jarvis, and updated on patient's condition.  All questions and concerns addressed.  Earleen Reaper RN

## 2018-12-13 NOTE — Progress Notes (Addendum)
PROGRESS NOTE  Billy Herman [id number] DOB: 1966/02/03 DOA: 12/11/2018  PCP: [name], MD  Brief History/Interval Summary: 53 year old male with a history of hypertension, OSA, BPH, aortic insufficiency presented to the emergency department at [facility] with worsening shortness of breath.  He was diagnosed with COVID-19 on September 21.  Patient was noted to be hypoxic.  He was hospitalized for further management.  He was transferred here to [facility].    Reason for Visit: Acute respiratory failure with hypoxia.  Pneumonia due to COVID-19.  Consultants: None  Procedures: None  Antibiotics: Anti-infectives (From admission, onward)   Start     Dose/Rate Route Frequency Ordered Stop   12/12/18 1600  remdesivir 100 mg in sodium chloride 0.9 % 250 mL IVPB     100 mg 500 mL/hr over 30 Minutes Intravenous Every 24 hours 12/11/18 2243 12/16/18 1559   12/11/18 2330  remdesivir 200 mg in sodium chloride 0.9 % 250 mL IVPB     200 mg 500 mL/hr over 30 Minutes Intravenous Once 12/11/18 2243 12/12/18 0030      Subjective/Interval History: Patient states that he is feeling much better this morning.  Not as short of breath as yesterday.  Feels stronger.  Has not urinated much in the last 12 hours.  Reports dark urine last night.  No nausea vomiting.  Oral intake has been poor.     Assessment/Plan:  Acute Hypoxic Resp. Failure due to Acute Covid 19 Viral Illness  COVID-19 Labs  Recent Labs    12/11/18 1142 12/11/18 1146 12/12/18 0054 12/13/18 0103  DDIMER  --   --  0.45 0.45  FERRITIN 2,479*  --  2,635* 2,074*  LDH 330*  --   --   --   CRP  --  1.8* 1.9* 1.2*   COVID-19 test was positive on September 21.  Results under care everywhere.  Fever: Remains afebrile Oxygen requirements: Nasal cannula at 4 L/min.  Saturating in the early 90s.   Antibacterials: None Remdesivir: Day 3 today Steroids: On dexamethasone twice daily.   Diuretics: Not on a regular basis  Actemra: Not given yet Vitamin C and Zinc: Continue DVT Prophylaxis:  Lovenox 60 mg daily  Patient's respiratory status appears to have improved.  He was requiring 8 L of oxygen yesterday and now he is down to 4 L of oxygen.  He feels better.  Inflammatory markers have improved.  Ferritin remains elevated.  D-dimer 0.45.  Continue with Remdesivir and steroids.  Since the patient is feeling better we will hold off on convalescent plasma.  Continue prone positioning as much as tolerated.  Incentive spirometry and mobilization.  Acute renal failure Surprisingly his creatinine has increased to 2.1 and BUN has increased to 59.  He does report poor oral intake yesterday.  He has had dark urine.  Denies any diarrhea.  This is most likely still due to hypovolemia in the setting of ARB.  We will hold his ARB.  He has had borderline low blood pressures.  We will give him normal saline bolus followed by infusion.  Monitor urine output.  Recheck labs tomorrow.  UA done yesterday did not show any RBCs or WBCs.  Mild proteinuria was noted.  Hyperglycemia due to steroids No history of diabetes.  Elevated glucose levels most likely due to steroids.  HbA1c 6.2.  Continue SSI.    Mild transaminitis Most likely secondary to COVID-19.  Continue to monitor.  Statin placed on hold.  Leukopenia Secondary to  COVID-19.  WBC noted to be normal this morning.  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  Continue to monitor.    History of obstructive sleep apnea He uses CPAP at home.  Currently on high flow nasal cannula.  History of BPH Continue Cardura  History of benign essential hypertension As noted above low blood pressures noted.  Hold his antihypertensives.  Morbid obesity Estimated body mass index is 35.23 kg/m as calculated from the following:   Height as of this encounter: 6\' 1"  (1.854 m).   Weight as  of this encounter: 121.1 kg.   DVT Prophylaxis: Lovenox PUD Prophylaxis: Pepcid Code Status: Full code Family Communication: Discussed with patient.  Wife will be updated later today. Disposition Plan: Mobilize as tolerated.  Hopefully home when ready for discharge.     Medications:  Scheduled: . aspirin EC  81 mg Oral QPM  . dexamethasone (DECADRON) injection  6 mg Intravenous Q12H  . [START ON 12/14/2018] doxazosin  2 mg Oral Daily  . enoxaparin (LOVENOX) injection  60 mg Subcutaneous QHS  . famotidine  20 mg Oral Daily  . feeding supplement (ENSURE ENLIVE)  237 mL Oral BID BM  . influenza vac split quadrivalent PF  0.5 mL Intramuscular Tomorrow-1000  . insulin aspart  0-15 Units Subcutaneous TID WC  . Ipratropium-Albuterol  1 puff Inhalation Q6H  . sodium chloride flush  3 mL Intravenous Q12H  . vitamin C  500 mg Oral Daily  . zinc sulfate  220 mg Oral Daily   Continuous: . sodium chloride    . sodium chloride 75 mL/hr at 12/13/18 0945  . remdesivir 100 mg in NS 250 mL 100 mg (12/12/18 1645)   [id number] chloride, acetaminophen, guaiFENesin-dextromethorphan, sodium chloride flush, traMADol   Objective:  Vital Signs  Vitals:   12/13/18 0525 12/13/18 0600 12/13/18 0724 12/13/18 0800  BP:   107/65 119/76  Pulse: (!) 48 (!) 50 (!) 57 71  Resp: 15 16 18  (!) 24  Temp:    97.8 F (36.6 C)  TempSrc:    Axillary  SpO2: 94% 94% 96% 95%  Weight:      Height:        Intake/Output Summary (Last 24 hours) at 12/13/2018 0947 Last data filed at 12/13/2018 0600 Gross per 24 hour  Intake 1210 ml  Output 0 ml  Net 1210 ml   Filed Weights   12/11/18 2200  Weight: 121.1 kg    General appearance: Awake alert.  In no distress.  Morbidly obese Resp: Coarse breath sounds bilaterally.  Normal effort noted this morning.  Few crackles at the bases.  No wheezing or rhonchi.   Cardio: S1-S2 is normal regular.  No S3-S4.  No rubs murmurs or bruit GI: Abdomen is soft.  Nontender  nondistended.  Bowel sounds are present normal.  No masses organomegaly Extremities: No edema.  Full range of motion of lower extremities. Neurologic: Alert and oriented x3.  No focal neurological deficits.    Lab Results:  Data Reviewed: I have personally reviewed following labs and imaging studies  CBC: Recent Labs  Lab 12/11/18 1142 12/12/18 0054 12/13/18 0103  WBC 5.6 2.9* 5.7  NEUTROABS 4.2 2.2 4.4  HGB 13.1 12.8* 12.3*  HCT 39.5 39.7 38.1*  MCV 90.4 91.9 92.3  PLT 284 346 414*    Basic Metabolic Panel: Recent Labs  Lab 12/11/18 1142 12/12/18 0054 12/13/18 0103  NA 142 143  142  K 3.8 3.9 4.2  CL 104 107 106  CO2 23 24 25   GLUCOSE 120* 168* 154*  BUN 21* 24* 59*  CREATININE 0.91 0.95 2.15*  CALCIUM 8.9 9.2 9.3  MG  --  2.3 2.8*    GFR: Estimated Creatinine Clearance: 53.6 mL/min (A) (by C-G formula based on SCr of 2.15 mg/dL (H)).  Liver Function Tests: Recent Labs  Lab 12/11/18 1142 12/12/18 0054 12/13/18 0103  AST 69* 56* 83*  ALT 43 43 63*  ALKPHOS 82 89 88  BILITOT 1.7* 1.4* 1.0  PROT 8.0 7.5 7.2  ALBUMIN 4.1 3.8 3.9    Lipid Profile: Recent Labs    12/11/18 1142  TRIG 240*     Anemia Panel: Recent Labs    12/12/18 0054 12/13/18 0103  FERRITIN 2,635* 2,074*    Recent Results (from the past 240 hour(s))  Blood Culture (routine x 2)     Status: None (Preliminary result)   Collection Time: 12/11/18 11:42 AM   Specimen: BLOOD  Result Value Ref Range Status   Specimen Description BLOOD BLOOD LEFT HAND  Final   Special Requests   Final    BOTTLES DRAWN AEROBIC AND ANAEROBIC Blood Culture results may not be optimal due to an excessive volume of blood received in culture bottles   Culture   Final    NO GROWTH 2 DAYS Performed at [facility], [address] [place], [place] [address]    Report Status PENDING  Incomplete  Blood Culture (routine x 2)     Status: None (Preliminary result)   Collection Time: 12/11/18 12:02  PM   Specimen: BLOOD  Result Value Ref Range Status   Specimen Description BLOOD BLOOD LEFT FOREARM  Final   Special Requests   Final    BOTTLES DRAWN AEROBIC AND ANAEROBIC Blood Culture adequate volume   Culture   Final    NO GROWTH 2 DAYS Performed at [facility], [address] [place], [place] [address]    Report Status PENDING  Incomplete      Radiology Studies: Portable Chest 1 View  Result Date: 12/12/2018 CLINICAL DATA:  Shortness of breath EXAM: PORTABLE CHEST 1 VIEW COMPARISON:  12/11/2018 FINDINGS: Cardiac shadow is enlarged but stable. The lungs are hypoinflated. Mild patchy parenchymal infiltrates are seen bilaterally but slightly improved when compared with the prior study. No other focal abnormality is noted. IMPRESSION: Slight improvement in the degree of bilateral parenchymal opacities. Electronically Signed   By: Inez Catalina M.D.   On: 12/12/2018 08:26   Dg Chest Port 1 View  Result Date: 12/11/2018 CLINICAL DATA:  Pt states he has been dealing with covid for 2 weeks now, increasing SHOB, headache and decreased appetite. Sent here by Dr for further evaluation. NAD. Sats 86% on RA. EXAM: PORTABLE CHEST 1 VIEW COMPARISON:  CT 05/19/2018 FINDINGS: The heart size is normal. There are patchy infiltrates within the lungs, predominantly involving the LATERAL portion of the LEFT lung and LEFT lung base. Patchy somewhat opacities are identified in the [place] lobe. No pleural effusions. No evidence for pulmonary edema. Visualized portion of the abdomen is unremarkable. IMPRESSION: 1. Findings consistent with multifocal infection. 2. Follow-up is recommended to document clearing. Electronically Signed   By: Nolon Nations M.D.   On: 12/11/2018 11:57       LOS: 2 days   [facility] Pager on [email or web address]  12/13/2018, 9:47 AM

## 2018-12-14 LAB — CBC WITH DIFFERENTIAL/PLATELET
Abs Immature Granulocytes: 0.06 10*3/uL (ref 0.00–0.07)
Basophils Absolute: 0 10*3/uL (ref 0.0–0.1)
Basophils Relative: 0 %
Eosinophils Absolute: 0 10*3/uL (ref 0.0–0.5)
Eosinophils Relative: 0 %
HCT: 35.1 % — ABNORMAL LOW (ref 39.0–52.0)
Hemoglobin: 11.2 g/dL — ABNORMAL LOW (ref 13.0–17.0)
Immature Granulocytes: 1 %
Lymphocytes Relative: 9 %
Lymphs Abs: 0.6 10*3/uL — ABNORMAL LOW (ref 0.7–4.0)
MCH: 29.7 pg (ref 26.0–34.0)
MCHC: 31.9 g/dL (ref 30.0–36.0)
MCV: 93.1 fL (ref 80.0–100.0)
Monocytes Absolute: 0.6 10*3/uL (ref 0.1–1.0)
Monocytes Relative: 9 %
Neutro Abs: 5.6 10*3/uL (ref 1.7–7.7)
Neutrophils Relative %: 81 %
Platelets: 446 10*3/uL — ABNORMAL HIGH (ref 150–400)
RBC: 3.77 MIL/uL — ABNORMAL LOW (ref 4.22–5.81)
RDW: 13.2 % (ref 11.5–15.5)
WBC: 6.9 10*3/uL (ref 4.0–10.5)
nRBC: 0 % (ref 0.0–0.2)

## 2018-12-14 LAB — COMPREHENSIVE METABOLIC PANEL
ALT: 51 U/L — ABNORMAL HIGH (ref 0–44)
AST: 46 U/L — ABNORMAL HIGH (ref 15–41)
Albumin: 3.6 g/dL (ref 3.5–5.0)
Alkaline Phosphatase: 72 U/L (ref 38–126)
Anion gap: 10 (ref 5–15)
BUN: 66 mg/dL — ABNORMAL HIGH (ref 6–20)
CO2: 22 mmol/L (ref 22–32)
Calcium: 8.8 mg/dL — ABNORMAL LOW (ref 8.9–10.3)
Chloride: 113 mmol/L — ABNORMAL HIGH (ref 98–111)
Creatinine, Ser: 2.32 mg/dL — ABNORMAL HIGH (ref 0.61–1.24)
GFR calc Af Amer: 36 mL/min — ABNORMAL LOW (ref >60)
GFR calc non Af Amer: 31 mL/min — ABNORMAL LOW (ref >60)
Glucose, Bld: 147 mg/dL — ABNORMAL HIGH (ref 70–99)
Potassium: 4 mmol/L (ref 3.5–5.1)
Sodium: 145 mmol/L (ref 135–145)
Total Bilirubin: 0.7 mg/dL (ref 0.3–1.2)
Total Protein: 6.5 g/dL (ref 6.5–8.1)

## 2018-12-14 LAB — GLUCOSE, CAPILLARY
Glucose-Capillary: 126 mg/dL — ABNORMAL HIGH (ref 70–99)
Glucose-Capillary: 134 mg/dL — ABNORMAL HIGH (ref 70–99)
Glucose-Capillary: 142 mg/dL — ABNORMAL HIGH (ref 70–99)
Glucose-Capillary: 140 mg/dL — ABNORMAL HIGH (ref 70–99)

## 2018-12-14 LAB — MAGNESIUM: Magnesium: 2.7 mg/dL — ABNORMAL HIGH (ref 1.7–2.4)

## 2018-12-14 LAB — FERRITIN: Ferritin: 1151 ng/mL — ABNORMAL HIGH (ref 24–336)

## 2018-12-14 LAB — D-DIMER, QUANTITATIVE: D-Dimer, Quant: 0.54 ug/mL-FEU — ABNORMAL HIGH (ref 0.00–0.50)

## 2018-12-14 LAB — C-REACTIVE PROTEIN: CRP: <0.8 mg/dL (ref <1.0)

## 2018-12-14 MED ORDER — ALPRAZOLAM 0.5 MG PO TABS
0.5000 mg | ORAL_TABLET | Freq: Three times a day (TID) | ORAL | Status: DC | PRN
  Administered 2018-12-14 (×2): 0.5 mg via ORAL
  Filled 2018-12-14 (×2): qty 1

## 2018-12-14 NOTE — Progress Notes (Signed)
Called and spoke with patient's wife, Venida Jarvis.  All questions and concerns addressed.  Earleen Reaper RN

## 2018-12-14 NOTE — Progress Notes (Signed)
   12/14/18 0143  Vitals  Pulse Rate (!) 55  ECG Heart Rate (!) 55  Resp 19  Oxygen Therapy  SpO2 94 %  O2 Device Room Air  Patient Activity (if Appropriate) In bed  Pulse Oximetry Type Continuous  MEWS Score  MEWS RR 0  MEWS Pulse 0  MEWS Systolic 0  MEWS LOC 0  MEWS Temp 0  MEWS Score 0  MEWS Score Color Green  Provider Notification  Provider Name/Title Dr. Shanon Brow  Date Provider Notified 12/14/18  Time Provider Notified [phone]  Notification Type Page  Notification Reason Change in status  Response No new orders   Received call from [place], advising that HR went into mid 58s and had a 2.75 second pause.  Upon assessment of patient, he is easily awoken and has no c/o of CP, dizziness, etc.   B/P is 108/55 HR - 50, Resp - 19, 93% RA.  Dr. Shanon Brow paged and made aware.  No additional orders received.  Again called by CCMD.  HR as low as 36 while asleep.  Patient again easily awakens with no complaints.  B/P 130/73.  HR - 55, Resp - 16, 93% RA.  Will continue to monitor patient.  Earleen Reaper RN

## 2018-12-14 NOTE — Progress Notes (Signed)
Patient became agitated that he was connected to pulse ox and IV. MD paged, continuous pulse ox d/c's. IV was secured, and patient is agreeable to continue IV fluids. MD also added PRN xanax if needed.

## 2018-12-14 NOTE — Progress Notes (Signed)
PROGRESS NOTE  Billy Herman [id number] DOB: June 27, 1965 DOA: 12/11/2018  PCP: [name], MD  Brief History/Interval Summary: 53 year old male with a history of hypertension, OSA, BPH, aortic insufficiency presented to the emergency department at [facility] with worsening shortness of breath.  He was diagnosed with COVID-19 on September 21.  Patient was noted to be hypoxic.  He was hospitalized for further management.  He was transferred here to [facility].    Reason for Visit: Acute respiratory failure with hypoxia.  Pneumonia due to COVID-19.  Consultants: None  Procedures: None  Antibiotics: Anti-infectives (From admission, onward)   Start     Dose/Rate Route Frequency Ordered Stop   12/12/18 1600  remdesivir 100 mg in sodium chloride 0.9 % 250 mL IVPB     100 mg 500 mL/hr over 30 Minutes Intravenous Every 24 hours 12/11/18 2243 12/16/18 1559   12/11/18 2330  remdesivir 200 mg in sodium chloride 0.9 % 250 mL IVPB     200 mg 500 mL/hr over 30 Minutes Intravenous Once 12/11/18 2243 12/12/18 0030      Subjective/Interval History: Patient states that he continues to feel better.  Shortness of breath is improved.  Denies any nausea vomiting.  Has made a lot of urine.      Assessment/Plan:  Acute Hypoxic Resp. Failure/Pneumonia due to COVID-19  COVID-19 Labs  Recent Labs    12/11/18 1142  12/12/18 0054 12/13/18 0103 12/14/18 0049  DDIMER  --   --  0.45 0.45 0.54*  FERRITIN 2,479*  --  2,635* 2,074* 1,151*  LDH 330*  --   --   --   --   CRP  --    < > 1.9* 1.2* <0.8   < > = values in this interval not displayed.     COVID-19 test was positive on September 21.  Results under care everywhere.  Fever: Afebrile Oxygen requirements: On room air this morning.  Saturating in the early 90s.   Antibacterials: None Remdesivir: Day 4 today Steroids: On dexamethasone twice daily.   Diuretics: Not on a regular basis Actemra: Not given yet Vitamin C and Zinc:  Continue DVT Prophylaxis:  Lovenox 60 mg daily  Patient's respiratory status is continues to improve.  He was initially requiring 8 L of oxygen.  He is now on room air saturating in the early 90s.  From a symptom standpoint he is feeling much better.  His inflammatory markers have improved.  CRP is normal.  Ferritin is down to 1151.  D-dimer 0.54.  Continue with the Remdesivir and steroids.  Continue prone positioning as tolerated.  Incentive spirometry and mobilization.  Acute renal failure Yesterday patient's creatinine jumped up to 2.1.  Noted to be 2.3 today.  Patient was given IV fluid boluses yesterday and started on infusion.  He was on ARB which was discontinued.  Patient has made greater than 2 L of urine over the last 24 hours.  Continue infusion for additional day.  Hopefully we will start seeing an improvement in the creatinine over the next 24 to 48 hours.  Patient was reassured.  Recheck labs tomorrow.  This is most likely due to hypovolemia in the setting of ARB.  UA did not show any RBCs or WBCs.  Mild proteinuria was noted.  Recommend rechecking UA at follow-up.  Hyperglycemia due to steroids No history of diabetes.  Elevated glucose levels most likely due to steroids.  HbA1c 6.2.  Continue SSI.    Mild transaminitis Most  likely secondary to COVID-19.  Continue to monitor.  Statin placed on hold.  LFTs are stable.  Leukopenia Secondary to COVID-19.  Resolved.  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  Continue to monitor.    History of obstructive sleep apnea He uses CPAP at home.  Currently on high flow nasal cannula.  History of BPH Continue Cardura  History of benign essential hypertension As noted above low blood pressures noted.  Hold his antihypertensives.  Blood pressures have stabilized.  Morbid obesity Estimated body mass index is 35.23 kg/m as calculated from  the following:   Height as of this encounter: 6\' 1"  (1.854 m).   Weight as of this encounter: 121.1 kg.   DVT Prophylaxis: Lovenox PUD Prophylaxis: Pepcid Code Status: Full code Family Communication: Discussed with patient.  Wife being updated daily. Disposition Plan: Mobilize.  Ambulate in the hallway.  Hopefully home when ready for discharge.   Medications:  Scheduled: . aspirin EC  81 mg Oral QPM  . dexamethasone (DECADRON) injection  6 mg Intravenous Q12H  . doxazosin  2 mg Oral Daily  . enoxaparin (LOVENOX) injection  60 mg Subcutaneous QHS  . famotidine  20 mg Oral Daily  . feeding supplement (ENSURE ENLIVE)  237 mL Oral BID BM  . influenza vac split quadrivalent PF  0.5 mL Intramuscular Tomorrow-1000  . insulin aspart  0-15 Units Subcutaneous TID WC  . Ipratropium-Albuterol  1 puff Inhalation Q6H  . sodium chloride flush  3 mL Intravenous Q12H   Continuous: . sodium chloride    . sodium chloride 75 mL/hr at 12/14/18 0924  . remdesivir 100 mg in NS 250 mL 100 mg (12/13/18 1711)   [id number] chloride, acetaminophen, guaiFENesin-dextromethorphan, sodium chloride flush, traMADol   Objective:  Vital Signs  Vitals:   12/14/18 0500 12/14/18 0510 12/14/18 0600 12/14/18 0746  BP:    120/75  Pulse: (!) 54 60 60 (!) 57  Resp: (!) 23 20 (!) 25 18  Temp:    97.6 F (36.4 C)  TempSrc:    Axillary  SpO2: 93% 92% 94% 93%  Weight:      Height:        Intake/Output Summary (Last 24 hours) at 12/14/2018 0959 Last data filed at 12/14/2018 0600 Gross per 24 hour  Intake 4264.75 ml  Output 2045 ml  Net 2219.75 ml   Filed Weights   12/11/18 2200  Weight: 121.1 kg    General appearance: Awake alert.  In no distress.  Morbidly obese Resp: Improved effort.  Normal this morning at rest.  Improved air entry bilaterally with fewer crackles.  No wheezing or rhonchi.   Cardio: S1-S2 is normal regular.  No S3-S4.  No rubs murmurs or bruit GI: Abdomen is soft.  Nontender  nondistended.  Bowel sounds are present normal.  No masses organomegaly Extremities: No edema.  Full range of motion of lower extremities. Neurologic: Alert and oriented x3.  No focal neurological deficits.     Lab Results:  Data Reviewed: I have personally reviewed following labs and imaging studies  CBC: Recent Labs  Lab 12/11/18 1142 12/12/18 0054 12/13/18 0103 12/14/18 0049  WBC 5.6 2.9* 5.7 6.9  NEUTROABS 4.2 2.2 4.4 5.6  HGB 13.1 12.8* 12.3* 11.2*  HCT 39.5 39.7 38.1* 35.1*  MCV 90.4 91.9 92.3 93.1  PLT 284 346 414* 446*    Basic Metabolic Panel: Recent Labs  Lab 12/11/18 1142 12/12/18  [id number] 12/13/18 0103 12/14/18 0049  NA 142 143 142 145  K 3.8 3.9 4.2 4.0  CL 104 107 106 113*  CO2 23 24 25 22   GLUCOSE 120* 168* 154* 147*  BUN 21* 24* 59* 66*  CREATININE 0.91 0.95 2.15* 2.32*  CALCIUM 8.9 9.2 9.3 8.8*  MG  --  2.3 2.8* 2.7*    GFR: Estimated Creatinine Clearance: 49.6 mL/min (A) (by C-G formula based on SCr of 2.32 mg/dL (H)).  Liver Function Tests: Recent Labs  Lab 12/11/18 1142 12/12/18 0054 12/13/18 0103 12/14/18 0049  AST 69* 56* 83* 46*  ALT 43 43 63* 51*  ALKPHOS 82 89 88 72  BILITOT 1.7* 1.4* 1.0 0.7  PROT 8.0 7.5 7.2 6.5  ALBUMIN 4.1 3.8 3.9 3.6    Lipid Profile: Recent Labs    12/11/18 1142  TRIG 240*     Anemia Panel: Recent Labs    12/13/18 0103 12/14/18 0049  FERRITIN 2,074* 1,151*    Recent Results (from the past 240 hour(s))  Blood Culture (routine x 2)     Status: None (Preliminary result)   Collection Time: 12/11/18 11:42 AM   Specimen: BLOOD  Result Value Ref Range Status   Specimen Description BLOOD BLOOD LEFT HAND  Final   Special Requests   Final    BOTTLES DRAWN AEROBIC AND ANAEROBIC Blood Culture results may not be optimal due to an excessive volume of blood received in culture bottles   Culture   Final    NO GROWTH 3 DAYS Performed at [facility], [address], [place], [place] [address]     Report Status PENDING  Incomplete  Blood Culture (routine x 2)     Status: None (Preliminary result)   Collection Time: 12/11/18 12:02 PM   Specimen: BLOOD  Result Value Ref Range Status   Specimen Description BLOOD BLOOD LEFT FOREARM  Final   Special Requests   Final    BOTTLES DRAWN AEROBIC AND ANAEROBIC Blood Culture adequate volume   Culture   Final    NO GROWTH 3 DAYS Performed at [facility], [address] [place], [place] [address]    Report Status PENDING  Incomplete      Radiology Studies: No results found.     LOS: 3 days   Hajime Asfaw [employer] on [email or web address]  12/14/2018, 9:59 AM

## 2018-12-14 NOTE — Progress Notes (Signed)
   12/14/18 0510  Vitals  Pulse Rate 60  ECG Heart Rate 67  Resp 20  Oxygen Therapy  SpO2 92 %  O2 Device Room Air  Patient Activity (if Appropriate) In bed  Pulse Oximetry Type Continuous  MEWS Score  MEWS RR 0  MEWS Pulse 0  MEWS Systolic 0  MEWS LOC 0  MEWS Temp 0  MEWS Score 0  MEWS Score Color Green  Provider Notification  Provider Name/Title Dr. Shanon Brow  Date Provider Notified 12/14/18  Time Provider Notified [phone]  Notification Type Page  Notification Reason Change in status  Response No new orders   Received call from [place] advising that patient had brief run of Atrial Flutter with HR of 135.  Upon assessment of patient, he is awake with no complaints and asymptomatic.  VS stable.  Dr. Shanon Brow made aware via text.  No new orders received.  Will continue to monitor patient.  Earleen Reaper RN

## 2018-12-15 LAB — BASIC METABOLIC PANEL
Anion gap: 11 (ref 5–15)
BUN: 56 mg/dL — ABNORMAL HIGH (ref 6–20)
CO2: 23 mmol/L (ref 22–32)
Calcium: 9 mg/dL (ref 8.9–10.3)
Chloride: 112 mmol/L — ABNORMAL HIGH (ref 98–111)
Creatinine, Ser: 1.77 mg/dL — ABNORMAL HIGH (ref 0.61–1.24)
GFR calc Af Amer: 49 mL/min — ABNORMAL LOW (ref >60)
GFR calc non Af Amer: 43 mL/min — ABNORMAL LOW (ref >60)
Glucose, Bld: 134 mg/dL — ABNORMAL HIGH (ref 70–99)
Potassium: 4.5 mmol/L (ref 3.5–5.1)
Sodium: 146 mmol/L — ABNORMAL HIGH (ref 135–145)

## 2018-12-15 LAB — GLUCOSE, CAPILLARY
Glucose-Capillary: 118 mg/dL — ABNORMAL HIGH (ref 70–99)
Glucose-Capillary: 109 mg/dL — ABNORMAL HIGH (ref 70–99)

## 2018-12-15 LAB — FERRITIN: Ferritin: 1275 ng/mL — ABNORMAL HIGH (ref 24–336)

## 2018-12-15 MED ORDER — FAMOTIDINE 20 MG PO TABS: 20.0000 mg | ORAL_TABLET | Freq: Every day | ORAL | 0 refills | Status: AC

## 2018-12-15 MED ORDER — INFLUENZA VAC SPLIT QUAD 0.5 ML IM SUSY
0.5000 mL | PREFILLED_SYRINGE | Freq: Once | INTRAMUSCULAR | Status: DC
  Filled 2018-12-15: qty 0.5

## 2018-12-15 MED ORDER — ZINC SULFATE 220 (50 ZN) MG PO CAPS: 220.0000 mg | ORAL_CAPSULE | Freq: Every day | ORAL | 0 refills | Status: AC

## 2018-12-15 MED ORDER — ROSUVASTATIN CALCIUM 10 MG PO TABS: 10.0000 mg | ORAL_TABLET | Freq: Every evening | ORAL | 3 refills | Status: AC

## 2018-12-15 MED ORDER — VITAMIN C 500 MG PO TABS: 500.0000 mg | ORAL_TABLET | Freq: Every day | ORAL | 0 refills | Status: AC

## 2018-12-15 MED ORDER — DEXAMETHASONE 2 MG PO TABS: ORAL_TABLET | ORAL | 0 refills | Status: AC

## 2018-12-15 NOTE — Progress Notes (Signed)
Patient alert and able to ambulate in room without distress. Patient updated his wife via phone. All discharge instructions explained to patient and copies sent home with belongings. Patient given information on continued quarentine period and declined  Out of work note at this time. Family members at home also positive, educated to continue to monitor symptoms of all family members. Prescriptions sent to verified pharmacy. Patient has no questions at this time. IV removed and pt awaiting ride home.

## 2018-12-15 NOTE — Discharge Instructions (Signed)

## 2018-12-15 NOTE — Discharge Summary (Signed)
[facility]  Physician Discharge Summary   Patient ID: Billy Herman MRN: [id number] DOB/AGE: [birth year] 53 y.o.  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 Outpatient follow-up with PCP 2. To hold ARB till f/u with PCP 3. Blood work to check renal function and liver function in a few weeks.    Home Health: None Equipment/Devices: None  CODE STATUS: Full code  DISCHARGE CONDITION: fair  Diet recommendation: As before.  Patient instructed to stay hydrated.  INITIAL HISTORY: 53 year old male with a history of hypertension, OSA, BPH, aortic insufficiency presented to the emergency department at [facility] with worsening shortness of breath.  He was diagnosed with COVID-19 on September 21.  Patient was noted to be hypoxic.  He was hospitalized for further management.  He was transferred here to [facility].     HOSPITAL COURSE:   Acute Hypoxic Resp. Failure/Pneumonia due to COVID-19 Patient was hospitalized.  Initially requiring 8 L of oxygen.  He was placed on Remdesivir and steroids.  His CRP was not significantly elevated.  Ferritin was elevated.  Patient started improving.  He is now saturating normal on room air.  Ferritin has improved to 1000 151.  Patient will get last dose of Remdesivir today.  He will be discharged on tapering doses of steroids.    Acute renal failure secondary to hypovolemia in the setting of ARB Patient came in with creatinine of 0.9.  Unfortunately his creatinine jumped to 2.1.  This was in the  setting of dehydration along with the fact that he was on ARB.  ARB was discontinued.  Patient was aggressively hydrated.  Creatinine has improved to 1.7 today patient has good urine output.  Will recommend continue to hold ARB.  Follow-up with PCP for repeat labs.  Anticipate his renal function will return back to normal.  He was asked to stay well-hydrated at home.   Hyperglycemia due to steroids No history of diabetes.  Elevated glucose levels most likely due to steroids.  HbA1c 6.2.    Glucose level should improve as steroid is tapered off.   Mild transaminitis Most likely secondary to COVID-19.  LFTs are stable.  May resume statin after 2 weeks.  Leukopenia Secondary to COVID-19.  Resolved.  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   History of obstructive sleep apnea He uses CPAP at home.    History of BPH Continue Cardura  History of benign essential hypertension Antihypertensives were held as the patient had low blood pressure due to hypovolemia.  ARB has been held for now.  He may resume his metoprolol for cardiac reasons.  Morbid obesity Estimated body mass index is 35.23 kg/m as calculated from the following:   Height as of this encounter: 6\' 1"  (1.854 m).   Weight as of this encounter: 121.1 kg.  Acute anxiety Yesterday patient had an acute anxiety episode.  He started crying.  He wanted to go home.  Patient was reassured.  He was given Xanax with significant improvement.  Feels much better this morning.  Excited about going home today which I think will  help.  Will not prescribe him any anxiolytic agents at this time.  Overall stable.  Okay for discharge home today.  Discussed with patient and his wife.   PERTINENT LABS:  The results of significant diagnostics from this hospitalization (including imaging, microbiology, ancillary and laboratory) are listed  below for reference.    Microbiology: Recent Results (from the past 240 hour(s))  Blood Culture (routine x 2)     Status: None (Preliminary result)   Collection Time: 12/11/18 11:42 AM   Specimen: BLOOD  Result Value Ref Range Status   Specimen Description BLOOD BLOOD LEFT HAND  Final   Special Requests   Final    BOTTLES DRAWN AEROBIC AND ANAEROBIC Blood Culture results may not be optimal due to an excessive volume of blood received in culture bottles   Culture   Final    NO GROWTH 4 DAYS Performed at [facility], [address] [place], [place] [address]    Report Status PENDING  Incomplete  Blood Culture (routine x 2)     Status: None (Preliminary result)   Collection Time: 12/11/18 12:02 PM   Specimen: BLOOD  Result Value Ref Range Status   Specimen Description BLOOD BLOOD LEFT FOREARM  Final   Special Requests   Final    BOTTLES DRAWN AEROBIC AND ANAEROBIC Blood Culture adequate volume   Culture   Final    NO GROWTH 4 DAYS Performed at [facility], [address] [place], [place] [address]    Report Status PENDING  Incomplete     Labs:  COVID-19 Labs  Recent Labs    12/13/18 0103 12/14/18 0049 12/15/18 0210  DDIMER 0.45 0.54*  --   FERRITIN 2,074* 1,151* 1,275*  CRP 1.2* <0.8  --     Lab Results  Component Value Date   SARSCOV2NAA NEGATIVE 09/22/2018      Basic Metabolic Panel: Recent Labs  Lab 12/11/18 1142 12/12/18 0054 12/13/18 0103 12/14/18 0049 12/15/18 0210  NA 142 143 142 145 146*  K 3.8 3.9 4.2 4.0 4.5  CL 104 107 106 113* 112*  CO2 23 24 25 22 23   GLUCOSE 120* 168* 154* 147* 134*  BUN 21* 24* 59* 66* 56*  CREATININE 0.91 0.95 2.15* 2.32* 1.77*  CALCIUM 8.9 9.2 9.3 8.8* 9.0  MG  --  2.3 2.8* 2.7*  --    Liver Function Tests: Recent Labs  Lab 12/11/18 1142 12/12/18 0054 12/13/18 0103 12/14/18 0049  AST 69* 56* 83* 46*  ALT 43 43 63* 51*  ALKPHOS 82 89 88 72  BILITOT 1.7* 1.4* 1.0 0.7  PROT 8.0 7.5 7.2  6.5  ALBUMIN 4.1 3.8 3.9 3.6   CBC: Recent Labs  Lab 12/11/18 1142 12/12/18 0054 12/13/18 0103 12/14/18 0049  WBC 5.6 2.9* 5.7 6.9  NEUTROABS 4.2 2.2 4.4 5.6  HGB 13.1 12.8* 12.3* 11.2*  HCT 39.5 39.7 38.1* 35.1*  MCV 90.4 91.9 92.3 93.1  PLT 284 346 414* 446*    CBG: Recent Labs  Lab 12/14/18 1220 12/14/18 1647 12/14/18 2144 12/15/18 0806 12/15/18 1153  GLUCAP 134* 142* 140* 118* 109*     IMAGING STUDIES Portable Chest 1 View  Result Date: 12/12/2018 CLINICAL DATA:  Shortness of breath EXAM: PORTABLE CHEST 1 VIEW COMPARISON:  12/11/2018 FINDINGS: Cardiac shadow is enlarged but stable. The lungs are hypoinflated. Mild patchy parenchymal infiltrates are seen bilaterally but slightly improved when compared with the prior study. No other focal abnormality is noted. IMPRESSION: Slight improvement  in the degree of bilateral parenchymal opacities. Electronically Signed   By: Inez Catalina M.D.   On: 12/12/2018 08:26   Dg Chest Port 1 View  Result Date: 12/11/2018 CLINICAL DATA:  Pt states he has been dealing with covid for 2 weeks now, increasing SHOB, headache and decreased appetite. Sent here by Dr for further evaluation. NAD. Sats 86% on RA. EXAM: PORTABLE CHEST 1 VIEW COMPARISON:  CT 05/19/2018 FINDINGS: The heart size is normal. There are patchy infiltrates within the lungs, predominantly involving the LATERAL portion of the LEFT lung and LEFT lung base. Patchy somewhat opacities are identified in the [place] lobe. No pleural effusions. No evidence for pulmonary edema. Visualized portion of the abdomen is unremarkable. IMPRESSION: 1. Findings consistent with multifocal infection. 2. Follow-up is recommended to document clearing. Electronically Signed   By: Nolon Nations M.D.   On: 12/11/2018 11:57    DISCHARGE EXAMINATION: Vitals:   12/14/18 1736 12/14/18 1958 12/15/18 0602 12/15/18 0809  BP: 134/66 122/75 100/62 112/72  Pulse: 61 (!) 55 66   Resp: 18 16 18  18   Temp: 97.6 F (36.4 C) 98 F (36.7 C) 97.9 F (36.6 C) 97.6 F (36.4 C)  TempSrc: Axillary Oral Oral Oral  SpO2: 97% 96% 97%   Weight:      Height:       General appearance: Awake alert.  In no distress Resp: Coarse breath sounds bilaterally with a few crackles at the bases.  No wheezing or rhonchi.  Normal effort. Cardio: S1-S2 is normal regular.  No S3-S4.  No rubs murmurs or bruit GI: Abdomen is soft.  Nontender nondistended.  Bowel sounds are present normal.  No masses organomegaly Extremities: No edema.  Full range of motion of lower extremities. Neurologic: Alert and oriented x3.  No focal neurological deficits.    DISPOSITION: Home  Discharge Instructions    Call MD for:  difficulty breathing, headache or visual disturbances   Complete by: As directed    Call MD for:  extreme fatigue   Complete by: As directed    Call MD for:  persistant dizziness or light-headedness   Complete by: As directed    Call MD for:  persistant nausea and vomiting   Complete by: As directed    Call MD for:  severe uncontrolled pain   Complete by: As directed    Call MD for:  temperature >100.4   Complete by: As directed    Discharge instructions   Complete by: As directed    Please keep your self well-hydrated.  Call your doctor if you have noticed that you are not urinating much or if your urine is again dark in color.  Take your medications as prescribed.  We are holding 1 of your blood pressure medications, losartan.  Discussed with your doctor at follow-up.  COVID 19 INSTRUCTIONS  - You are felt to be stable enough to no longer require inpatient monitoring, testing, and treatment, though you will need to follow the recommendations below: - Based on the CDC's non-test criteria for ending self-isolation: You may not return to work/leave the home until at least 21 days since symptom onset AND 3 days without a fever (without taking tylenol, ibuprofen, etc.) AND have improvement in  respiratory symptoms. - Do not take NSAID medications (including, but not limited to, ibuprofen, advil, motrin, naproxen, aleve, goody's powder, etc.) - Follow up with your doctor in the next week via telehealth or seek medical attention right away if  your symptoms get WORSE.  - Consider donating plasma after you have recovered (either 14 days after a negative test or 28 days after symptoms have completely resolved) because your antibodies to this virus may be helpful to give to others with life-threatening infections. Please go to the website www.[email or web address] if you would like to consider volunteering for plasma donation.    Directions for you at home:  Wear a facemask You should wear a facemask that covers your nose and mouth when you are in the same room with other people and when you visit a healthcare provider. People who live with or visit you should also wear a facemask while they are in the same room with you.  Separate yourself from other people in your home As much as possible, you should stay in a different room from other people in your home. Also, you should use a separate bathroom, if available.  Avoid sharing household items You should not share dishes, drinking glasses, cups, eating utensils, towels, bedding, or other items with other people in your home. After using these items, you should wash them thoroughly with soap and water.  Cover your coughs and sneezes Cover your mouth and nose with a tissue when you cough or sneeze, or you can cough or sneeze into your sleeve. Throw used tissues in a lined trash can, and immediately wash your hands with soap and water for at least 20 seconds or use an alcohol-based hand rub.  Wash your [facility] your hands often and thoroughly with soap and water for at least 20 seconds. You can use an alcohol-based hand sanitizer if soap and water are not available and if your hands are not visibly dirty. Avoid touching your eyes, nose, and  mouth with unwashed hands.  Directions for those who live with, or provide care at home for you:  Limit the number of people who have contact with the patient If possible, have only one caregiver for the patient. Other household members should stay in another home or place of residence. If this is not possible, they should stay in another room, or be separated from the patient as much as possible. Use a separate bathroom, if available. Restrict visitors who do not have an essential need to be in the home.  Ensure good ventilation Make sure that shared spaces in the home have good air flow, such as from an air conditioner or an opened window, weather permitting.  Wash your hands often Wash your hands often and thoroughly with soap and water for at least 20 seconds. You can use an alcohol based hand sanitizer if soap and water are not available and if your hands are not visibly dirty. Avoid touching your eyes, nose, and mouth with unwashed hands. Use disposable paper towels to dry your hands. If not available, use dedicated cloth towels and replace them when they become wet.  Wear a facemask and gloves Wear a disposable facemask at all times in the room and gloves when you touch or have contact with the patient's blood, body fluids, and/or secretions or excretions, such as sweat, saliva, sputum, nasal mucus, vomit, urine, or feces.  Ensure the mask fits over your nose and mouth tightly, and do not touch it during use. Throw out disposable facemasks and gloves after using them. Do not reuse. Wash your hands immediately after removing your facemask and gloves. If your personal clothing becomes contaminated, carefully remove clothing and launder. Wash your hands after handling contaminated clothing. Place all used  disposable facemasks, gloves, and other waste in a lined container before disposing them with other household waste. Remove gloves and wash your hands immediately after handling these  items.  Do not share dishes, glasses, or other household items with the patient Avoid sharing household items. You should not share dishes, drinking glasses, cups, eating utensils, towels, bedding, or other items with a patient who is confirmed to have, or being evaluated for, COVID-19 infection. After the person uses these items, you should wash them thoroughly with soap and water.  Wash laundry thoroughly Immediately remove and wash clothes or bedding that have blood, body fluids, and/or secretions or excretions, such as sweat, saliva, sputum, nasal mucus, vomit, urine, or feces, on them. Wear gloves when handling laundry from the patient. Read and follow directions on labels of laundry or clothing items and detergent. In general, wash and dry with the warmest temperatures recommended on the label.  Clean all areas the individual has used often Clean all touchable surfaces, such as counters, tabletops, doorknobs, bathroom fixtures, toilets, phones, keyboards, tablets, and bedside tables, every day. Also, clean any surfaces that may have blood, body fluids, and/or secretions or excretions on them. Wear gloves when cleaning surfaces the patient has come in contact with. Use a diluted bleach solution (e.g., dilute bleach with 1 part bleach and 10 parts water) or a household disinfectant with a label that says EPA-registered for coronaviruses. To make a bleach solution at home, add 1 tablespoon of bleach to 1 quart (4 cups) of water. For a larger supply, add  cup of bleach to 1 gallon (16 cups) of water. Read labels of cleaning products and follow recommendations provided on product labels. Labels contain instructions for safe and effective use of the cleaning product including precautions you should take when applying the product, such as wearing gloves or eye protection and making sure you have good ventilation during use of the product. Remove gloves and wash hands immediately after cleaning.   Monitor yourself for signs and symptoms of illness Caregivers and household members are considered close contacts, should monitor their health, and will be asked to limit movement outside of the home to the extent possible. Follow the monitoring steps for close contacts listed on the symptom monitoring form.   If you have additional questions, contact your local health department or call the epidemiologist on call at [phone] (available 24/7). This guidance is subject to change. For the most up-to-date guidance from [facility], please refer to their website: YouBlogs.pl   You were cared for by a hospitalist during your hospital stay. If you have any questions about your discharge medications or the care you received while you were in the hospital after you are discharged, you can call the unit and asked to speak with the hospitalist on call if the hospitalist that took care of you is not available. Once you are discharged, your primary care physician will handle any further medical issues. Please note that NO REFILLS for any discharge medications will be authorized once you are discharged, as it is imperative that you return to your primary care physician (or establish a relationship with a primary care physician if you do not have one) for your aftercare needs so that they can reassess your need for medications and monitor your lab values. If you do not have a primary care physician, you can call [phone] for a physician referral.   Increase activity slowly   Complete by: As directed    MyChart COVID-19 home monitoring  program   Complete by: Dec 15, 2018    Is the patient willing to use the [place] for home monitoring?: Yes   Temperature monitoring   Complete by: Dec 15, 2018    After how many days would you like to receive a notification of this patient's flowsheet entries?: 1         Allergies as of 12/15/2018   No  Known Allergies     Medication List    STOP taking these medications   losartan 100 MG tablet Commonly known as: COZAAR     TAKE these medications   aspirin EC 81 MG tablet Take 81 mg by mouth every evening.   dexamethasone 2 MG tablet Commonly known as: DECADRON Take 3 tablets once daily for 3 days, then 2 tablets once daily for 3 days, then 1 tablet once daily for 3 days, then STOP.   doxazosin 2 MG tablet Commonly known as: CARDURA Take 2 mg by mouth daily.   ergocalciferol 1.25 MG (50000 UT) capsule Commonly known as: VITAMIN D2 Take 50,000 Units by mouth once a week. Fridays   famotidine 20 MG tablet Commonly known as: PEPCID Take 1 tablet (20 mg total) by mouth daily for 10 days. Start taking on: December 16, 2018   fenofibrate micronized 134 MG capsule Commonly known as: LOFIBRA Take 134 mg by mouth every evening.   ferrous sulfate 325 (65 FE) MG tablet Take 325 mg by mouth every evening.   metoprolol succinate 25 MG 24 hr tablet Commonly known as: TOPROL-XL Take 25 mg by mouth every evening.   ondansetron 4 MG tablet Commonly known as: ZOFRAN Take 4 mg by mouth 3 (three) times daily.   rosuvastatin 10 MG tablet Commonly known as: CRESTOR Take 1 tablet (10 mg total) by mouth every evening. RESUME AFTER 2 WEEKS Start taking on: December 29, 2018 What changed:   additional instructions  These instructions start on December 29, 2018. If you are unsure what to do until then, ask your doctor or other care provider.   traMADol 50 MG tablet Commonly known as: ULTRAM Take 50 mg by mouth every 8 (eight) hours as needed for pain.   vitamin C 500 MG tablet Commonly known as: ASCORBIC ACID Take 1 tablet (500 mg total) by mouth daily for 10 days.   zinc sulfate 220 (50 Zn) MG capsule Take 1 capsule (220 mg total) by mouth daily for 10 days.        Follow-up Information    [name], MD. Schedule an appointment as soon as possible for a visit in 2  week(s).   Specialty: Internal Medicine Contact information: [address] [place] [address] [phone]           TOTAL DISCHARGE TIME: 53 minutes  [place]  [facility] Pager on [email or web address]  12/15/2018, 2:08 PM

## 2018-12-16 LAB — CULTURE, BLOOD (ROUTINE X 2)
Culture: NO GROWTH
Culture: NO GROWTH
Special Requests: ADEQUATE

## 2018-12-16 LAB — GLUCOSE, CAPILLARY: Glucose-Capillary: 159 mg/dL — ABNORMAL HIGH (ref 70–99)

## 2018-12-25 ENCOUNTER — Other Ambulatory Visit: Payer: Self-pay | Admitting: Internal Medicine

## 2018-12-25 DIAGNOSIS — J1282 Pneumonia due to coronavirus disease 2019: Secondary | ICD-10-CM

## 2018-12-25 DIAGNOSIS — U071 COVID-19: Secondary | ICD-10-CM

## 2019-01-09 ENCOUNTER — Other Ambulatory Visit: Payer: Self-pay

## 2019-02-19 ENCOUNTER — Other Ambulatory Visit: Payer: Self-pay

## 2019-02-19 DIAGNOSIS — D508 Other iron deficiency anemias: Secondary | ICD-10-CM

## 2019-02-19 DIAGNOSIS — R739 Hyperglycemia, unspecified: Secondary | ICD-10-CM

## 2019-02-19 DIAGNOSIS — R7989 Other specified abnormal findings of blood chemistry: Secondary | ICD-10-CM

## 2019-02-19 DIAGNOSIS — I10 Essential (primary) hypertension: Secondary | ICD-10-CM

## 2019-02-20 LAB — CBC WITH DIFFERENTIAL/PLATELET
Basophils Absolute: 0.1 10*3/uL (ref 0.0–0.2)
Basos: 1 %
EOS (ABSOLUTE): 0.2 10*3/uL (ref 0.0–0.4)
Eos: 3 %
Hematocrit: 38.1 % (ref 37.5–51.0)
Hemoglobin: 13.1 g/dL (ref 13.0–17.7)
Immature Grans (Abs): 0 10*3/uL (ref 0.0–0.1)
Immature Granulocytes: 1 %
Lymphocytes Absolute: 1.5 10*3/uL (ref 0.7–3.1)
Lymphs: 24 %
MCH: 31 pg (ref 26.6–33.0)
MCHC: 34.4 g/dL (ref 31.5–35.7)
MCV: 90 fL (ref 79–97)
Monocytes Absolute: 0.5 10*3/uL (ref 0.1–0.9)
Monocytes: 8 %
Neutrophils Absolute: 3.9 10*3/uL (ref 1.4–7.0)
Neutrophils: 63 %
Platelets: 384 10*3/uL (ref 150–450)
RBC: 4.22 x10E6/uL (ref 4.14–5.80)
RDW: 13.5 % (ref 11.6–15.4)
WBC: 6.2 10*3/uL (ref 3.4–10.8)

## 2019-02-20 LAB — COMPREHENSIVE METABOLIC PANEL
ALT: 22 IU/L (ref 0–44)
AST: 19 IU/L (ref 0–40)
Albumin/Globulin Ratio: 2 (ref 1.2–2.2)
Albumin: 4.9 g/dL (ref 3.8–4.9)
Alkaline Phosphatase: 71 IU/L (ref 39–117)
BUN/Creatinine Ratio: 20 (ref 9–20)
BUN: 20 mg/dL (ref 6–24)
Bilirubin Total: 0.4 mg/dL (ref 0.0–1.2)
CO2: 18 mmol/L — ABNORMAL LOW (ref 20–29)
Calcium: 9.8 mg/dL (ref 8.7–10.2)
Chloride: 105 mmol/L (ref 96–106)
Creatinine, Ser: 1 mg/dL (ref 0.76–1.27)
GFR calc Af Amer: 98 mL/min/{1.73_m2} (ref >59)
GFR calc non Af Amer: 85 mL/min/{1.73_m2} (ref >59)
Globulin, Total: 2.4 g/dL (ref 1.5–4.5)
Glucose: 97 mg/dL (ref 65–99)
Potassium: 3.9 mmol/L (ref 3.5–5.2)
Sodium: 143 mmol/L (ref 134–144)
Total Protein: 7.3 g/dL (ref 6.0–8.5)

## 2019-02-20 LAB — LIPID PANEL WITH LDL/HDL RATIO
Cholesterol, Total: 150 mg/dL (ref 100–199)
HDL: 43 mg/dL (ref >39)
LDL Chol Calc (NIH): 86 mg/dL (ref 0–99)
LDL/HDL Ratio: 2 ratio (ref 0.0–3.6)
Triglycerides: 118 mg/dL (ref 0–149)
VLDL Cholesterol Cal: 21 mg/dL (ref 5–40)

## 2019-02-20 LAB — URINALYSIS, ROUTINE W REFLEX MICROSCOPIC
Bilirubin, UA: NEGATIVE
Glucose, UA: NEGATIVE
Ketones, UA: NEGATIVE
Leukocytes,UA: NEGATIVE
Nitrite, UA: NEGATIVE
Protein,UA: NEGATIVE
RBC, UA: NEGATIVE
Specific Gravity, UA: 1.022 (ref 1.005–1.030)
Urobilinogen, Ur: 0.2 mg/dL (ref 0.2–1.0)
pH, UA: 5.5 (ref 5.0–7.5)

## 2019-02-20 LAB — IRON: Iron: 105 ug/dL (ref 38–169)

## 2019-02-20 LAB — PSA, TOTAL AND FREE
PSA, Free Pct: 43.9 %
PSA, Free: 1.01 ng/mL
Prostate Specific Ag, Serum: 2.3 ng/mL (ref 0.0–4.0)

## 2019-02-20 LAB — FERRITIN: Ferritin: 912 ng/mL — ABNORMAL HIGH (ref 30–400)

## 2019-02-21 LAB — URINE CULTURE: Organism ID, Bacteria: NO GROWTH

## 2019-05-21 HISTORY — PX: AORTIC VALVULOPLASTY: SHX142

## 2019-05-21 HISTORY — PX: OTHER SURGICAL HISTORY: SHX169

## 2019-08-28 ENCOUNTER — Encounter: Payer: Self-pay | Admitting: Nurse Practitioner

## 2019-08-28 ENCOUNTER — Ambulatory Visit: Payer: Self-pay | Admitting: Nurse Practitioner

## 2019-08-28 ENCOUNTER — Other Ambulatory Visit: Payer: Self-pay

## 2019-08-28 VITALS — BP 138/90 | HR 83 | Resp 20 | Ht 73.0 in | Wt 266.0 lb

## 2019-08-28 DIAGNOSIS — I10 Essential (primary) hypertension: Secondary | ICD-10-CM

## 2019-08-28 NOTE — Patient Instructions (Addendum)
Billy Herman after discussing how Losartan works I encourage you to continue to take it daily and consider in the am.  Check your blood pressure twice daily and call your cardiologist in another week if your blood pressure continues to run in the range you reported today. Heathy eating habits and exercise as tolerated discussed and encouraged Please return to the office any time during office hours for blood pressure checks or as needed  Healthy Eating Following a healthy eating pattern may help you to achieve and maintain a healthy body weight, reduce the risk of chronic disease, and live a long and productive life. It is important to follow a healthy eating pattern at an appropriate calorie level for your body. Your nutritional needs should be met primarily through food by choosing a variety of nutrient-rich foods. What are tips for following this plan? Reading food labels  Read labels and choose the following: ? Reduced or low sodium. ? Juices with 100% fruit juice. ? Foods with low saturated fats and high polyunsaturated and monounsaturated fats. ? Foods with whole grains, such as whole wheat, cracked wheat, brown rice, and wild rice. ? Whole grains that are fortified with folic acid. This is recommended for women who are pregnant or who want to become pregnant.  Read labels and avoid the following: ? Foods with a lot of added sugars. These include foods that contain brown sugar, corn sweetener, corn syrup, dextrose, fructose, glucose, high-fructose corn syrup, honey, invert sugar, lactose, malt syrup, maltose, molasses, raw sugar, sucrose, trehalose, or turbinado sugar.  Do not eat more than the following amounts of added sugar per day:  6 teaspoons (25 g) for women.  9 teaspoons (38 g) for men. ? Foods that contain processed or refined starches and grains. ? Refined grain products, such as white flour, degermed cornmeal, white bread, and white rice. Shopping  Choose nutrient-rich snacks,  such as vegetables, whole fruits, and nuts. Avoid high-calorie and high-sugar snacks, such as potato chips, fruit snacks, and candy.  Use oil-based dressings and spreads on foods instead of solid fats such as butter, stick margarine, or cream cheese.  Limit pre-made sauces, mixes, and "instant" products such as flavored rice, instant noodles, and ready-made pasta.  Try more plant-protein sources, such as tofu, tempeh, black beans, edamame, lentils, nuts, and seeds.  Explore eating plans such as the Mediterranean diet or vegetarian diet. Cooking  Use oil to saut or stir-fry foods instead of solid fats such as butter, stick margarine, or lard.  Try baking, boiling, grilling, or broiling instead of frying.  Remove the fatty part of meats before cooking.  Steam vegetables in water or broth. Meal planning    At meals, imagine dividing your plate into fourths: ? One-half of your plate is fruits and vegetables. ? One-fourth of your plate is whole grains. ? One-fourth of your plate is protein, especially lean meats, poultry, eggs, tofu, beans, or nuts.  Include low-fat dairy as part of your daily diet. Lifestyle  Choose healthy options in all settings, including home, work, school, restaurants, or stores.  Prepare your food safely: ? Wash your hands after handling raw meats. ? Keep food preparation surfaces clean by regularly washing with hot, soapy water. ? Keep raw meats separate from ready-to-eat foods, such as fruits and vegetables. ? Cook seafood, meat, poultry, and eggs to the recommended internal temperature. ? Store foods at safe temperatures. In general:  Keep cold foods at 45F (4.4C) or below.  Keep hot foods at 145F (  60C) or above.  Keep your freezer at [facility] (-17.8C) or below.  Foods are no longer safe to eat when they have been between the temperatures of 40-140F (4.4-60C) for more than 2 hours. What foods should I eat? Fruits Aim to eat 2 cup-equivalents  of fresh, canned (in natural juice), or frozen fruits each day. Examples of 1 cup-equivalent of fruit include 1 small apple, 8 large strawberries, 1 cup canned fruit,  cup dried fruit, or 1 cup 100% juice. Vegetables Aim to eat 2-3 cup-equivalents of fresh and frozen vegetables each day, including different varieties and colors. Examples of 1 cup-equivalent of vegetables include 2 medium carrots, 2 cups raw, leafy greens, 1 cup chopped vegetable (raw or cooked), or 1 medium baked potato. Grains Aim to eat 6 ounce-equivalents of whole grains each day. Examples of 1 ounce-equivalent of grains include 1 slice of bread, 1 cup ready-to-eat cereal, 3 cups popcorn, or  cup cooked rice, pasta, or cereal. Meats and other proteins Aim to eat 5-6 ounce-equivalents of protein each day. Examples of 1 ounce-equivalent of protein include 1 egg, 1/2 cup nuts or seeds, or 1 tablespoon (16 g) peanut butter. A cut of meat or fish that is the size of a deck of cards is about 3-4 ounce-equivalents.  Of the protein you eat each week, try to have at least 8 ounces come from seafood. This includes salmon, trout, herring, and anchovies. Dairy Aim to eat 3 cup-equivalents of fat-free or low-fat dairy each day. Examples of 1 cup-equivalent of dairy include 1 cup (240 mL) milk, 8 ounces (250 g) yogurt, 1 ounces (44 g) natural cheese, or 1 cup (240 mL) fortified soy milk. Fats and oils  Aim for about 5 teaspoons (21 g) per day. Choose monounsaturated fats, such as canola and olive oils, avocados, peanut butter, and most nuts, or polyunsaturated fats, such as sunflower, corn, and soybean oils, walnuts, pine nuts, sesame seeds, sunflower seeds, and flaxseed. Beverages  Aim for six 8-oz glasses of water per day. Limit coffee to three to five 8-oz cups per day.  Limit caffeinated beverages that have added calories, such as soda and energy drinks.  Limit alcohol intake to no more than 1 drink a day for nonpregnant women and 2  drinks a day for men. One drink equals 12 oz of beer (355 mL), 5 oz of wine (148 mL), or 1 oz of hard liquor (44 mL). Seasoning and other foods  Avoid adding excess amounts of salt to your foods. Try flavoring foods with herbs and spices instead of salt.  Avoid adding sugar to foods.  Try using oil-based dressings, sauces, and spreads instead of solid fats. This information is based on general [place] nutrition guidelines. For more information, visit [employer]. Exact amounts may vary based on your nutrition needs. Summary  A healthy eating plan may help you to maintain a healthy weight, reduce the risk of chronic diseases, and stay active throughout your life.  Plan your meals. Make sure you eat the right portions of a variety of nutrient-rich foods.  Try baking, boiling, grilling, or broiling instead of frying.  Choose healthy options in all settings, including home, work, school, restaurants, or stores. This information is not intended to replace advice given to you by your health care provider. Make sure you discuss any questions you have with your health care provider. Document Revised: 06/10/2017 Document Reviewed: 06/10/2017 Elsevier Patient Education  [place].

## 2019-08-28 NOTE — Progress Notes (Signed)
° °  Subjective:    Patient ID: Billy Herman, male    DOB: 08/01/1965, 54 y.o.   MRN: [id number]  HPI Eddie Dibbles comes to the [facility] today with request for a b/p check. He wanted to make sure his home b/p machine is correct. He reports he started to develop a headache a week ago and started checking his b/p more frequently to find his pressure running 861-U sytolically to upper 83'F-290'S diastolic range. He phoned his cardiologist at [facility] and was put on Losartan 50mg  which he's taking daily. 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 He's been doing well but does report fatigue. He works as a Production assistant, radio. He denies any SOB or chest pain.    Review of Systems  Constitutional: Positive for fatigue. Negative for fever.  Eyes: Negative for visual disturbance.  Respiratory: Negative for shortness of breath.   Cardiovascular: Negative for chest pain.  Gastrointestinal: Negative for abdominal distention.  Neurological: Negative for dizziness.       Objective:   Physical Exam Constitutional:      General: He is not in acute distress.    Appearance: Normal appearance. He is not ill-appearing, toxic-appearing or diaphoretic.  HENT:     Head: Normocephalic and atraumatic.  Cardiovascular:     Rate and Rhythm: Normal rate and regular rhythm.     Heart sounds: Normal heart sounds. No murmur heard.  No friction rub. No gallop.   Pulmonary:     Effort: Pulmonary effort is normal.     Breath sounds: No wheezing or rhonchi.  Abdominal:     General: Bowel sounds are normal.     Palpations: There is no mass.  Musculoskeletal:     Cervical back: Neck supple.  Skin:    General: Skin is warm and dry.  Neurological:     Mental Status: He is alert and oriented to person, place, and time.     Gait: Gait normal.  Psychiatric:        Mood and Affect: Mood normal.        Behavior: Behavior normal.           Assessment & Plan:

## 2019-12-03 ENCOUNTER — Other Ambulatory Visit: Payer: Self-pay | Admitting: Internal Medicine

## 2019-12-03 DIAGNOSIS — R0602 Shortness of breath: Secondary | ICD-10-CM

## 2019-12-03 DIAGNOSIS — R5381 Other malaise: Secondary | ICD-10-CM

## 2019-12-03 DIAGNOSIS — I77810 Thoracic aortic ectasia: Secondary | ICD-10-CM

## 2019-12-03 DIAGNOSIS — Z95828 Presence of other vascular implants and grafts: Secondary | ICD-10-CM

## 2019-12-16 ENCOUNTER — Ambulatory Visit: Payer: BC Managed Care – PPO

## 2020-02-18 ENCOUNTER — Other Ambulatory Visit: Payer: Self-pay | Admitting: Internal Medicine

## 2020-02-18 DIAGNOSIS — I77810 Thoracic aortic ectasia: Secondary | ICD-10-CM

## 2020-02-18 DIAGNOSIS — R5381 Other malaise: Secondary | ICD-10-CM

## 2020-02-18 DIAGNOSIS — Z95828 Presence of other vascular implants and grafts: Secondary | ICD-10-CM

## 2020-02-18 DIAGNOSIS — R0602 Shortness of breath: Secondary | ICD-10-CM

## 2020-08-02 IMAGING — CT CT ANGIOGRAPHY CHEST
2 of 6 series · 13 of 36 positions shown · IV contrast (iopamidol)
Comparison: October 08, 2017

CLINICAL DATA: Ascending thoracic aortic dilatation. Shortness of
breath

EXAM:
CT ANGIOGRAPHY CHEST WITH CONTRAST
TECHNIQUE: Multidetector CT imaging of the chest was performed using the
standard protocol during bolus administration of intravenous
contrast. Multiplanar CT image reconstructions and MIPs were
obtained to evaluate the vascular anatomy.
CONTRAST:  75mL 2KGI02-WE5 IOPAMIDOL (2KGI02-WE5) INJECTION 76%

[Series 5: axial arterial cta thorax · axial · arterial · 0.79mm/px · z∈[-1322,-990]mm · 12 of 198 slices shown]
[im 16/198  lung]
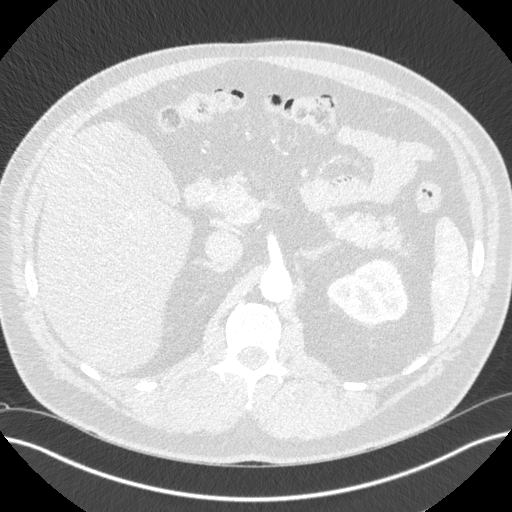
[im 31/198  mediastinal]
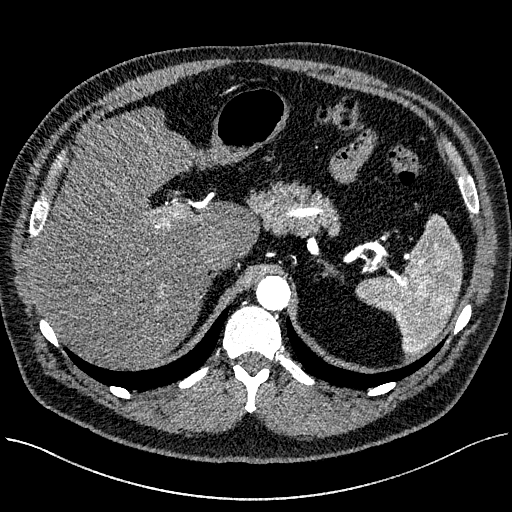
[im 46/198  lung]
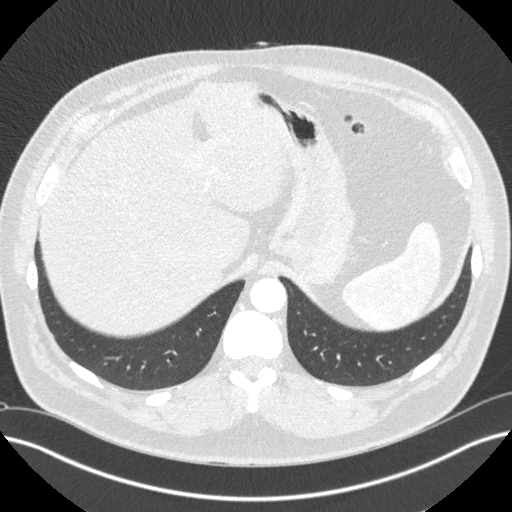
[im 61/198  mediastinal]
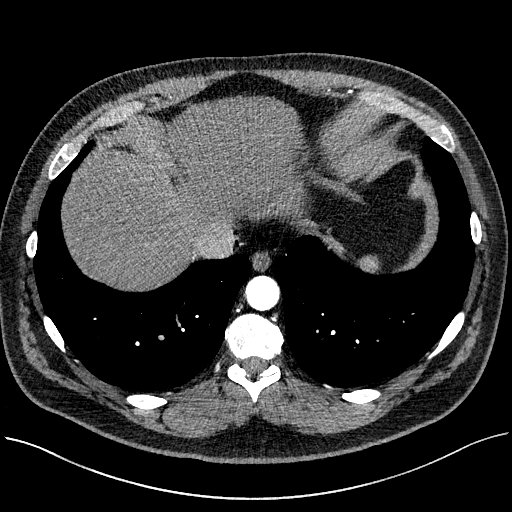
[im 76/198  lung]
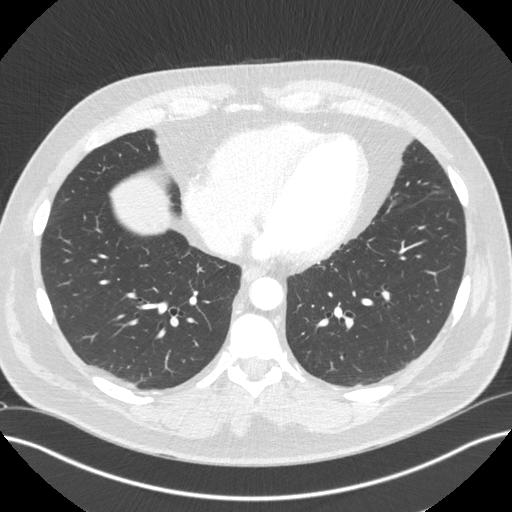
[im 91/198  mediastinal]
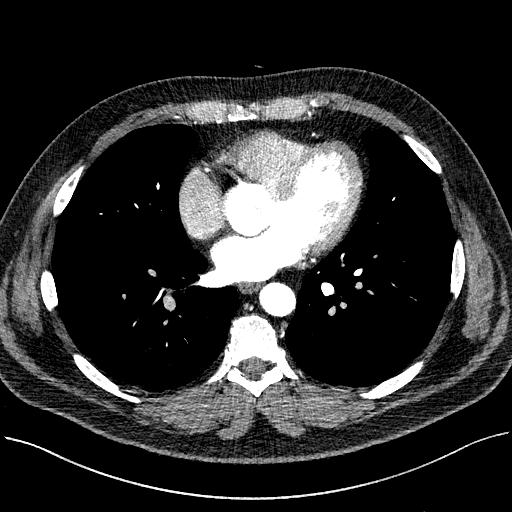
[im 107/198  lung]
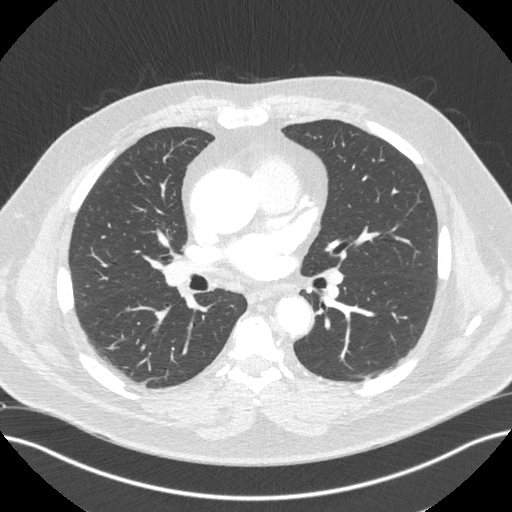
[im 122/198  mediastinal]
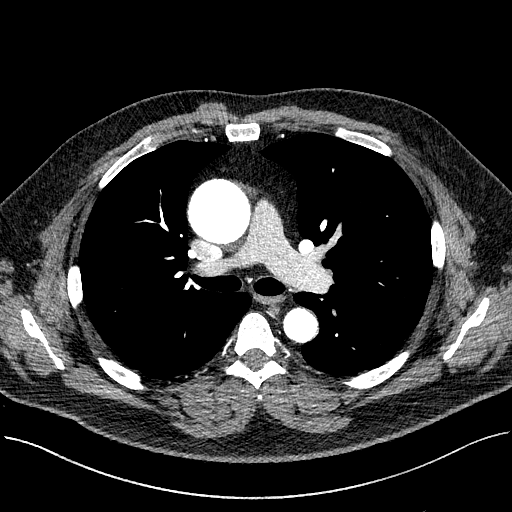
[im 137/198  lung]
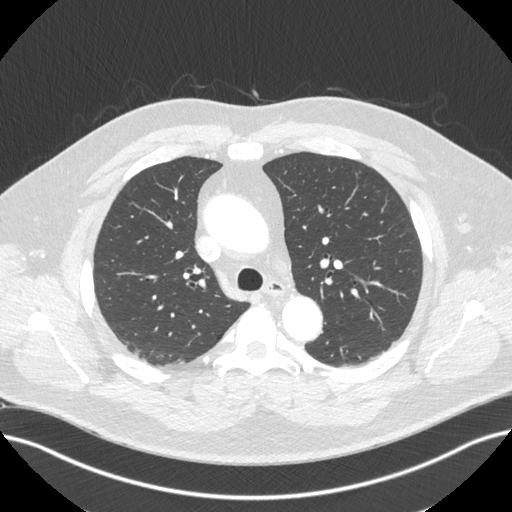
[im 152/198  mediastinal]
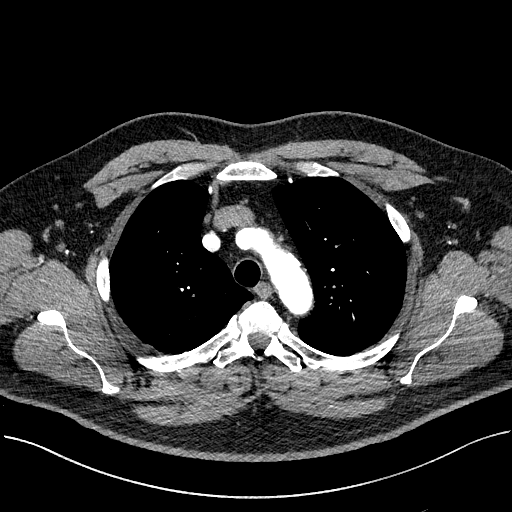
[im 167/198  lung]
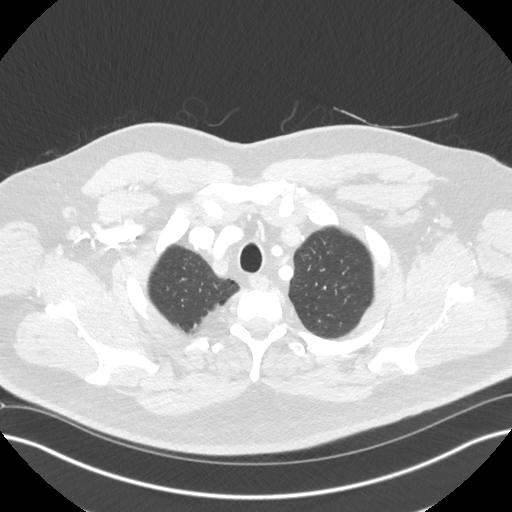
[im 182/198  mediastinal]
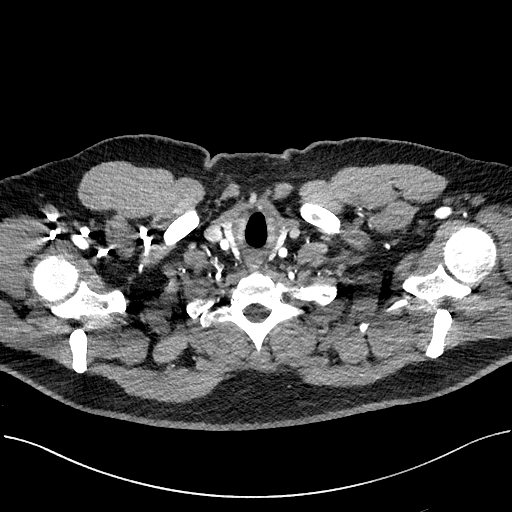

[Series 8: cor st cta thorax · coronal · 0.78mm/px · 1 of 170 slices shown]
[im 85/170  mediastinal]
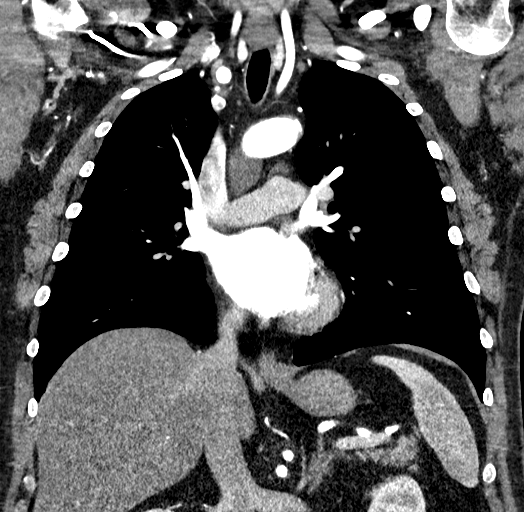

[13 of 36 positions shown; findings below may reference images not displayed]

FINDINGS: Cardiovascular: The ascending thoracic aortic diameter remains
stable at 5.2 x 5.0 cm. No thoracic aortic dissection evident. The
measured value of the aorta at the sinotubular junction on the
coronal oblique images is stable at 4.1 cm. The descending thoracic
aorta measures 3.1 x 2.8 cm. The maximum measured diameter in the
aortic arch region measures 2.8 cm. The appearance of the aorta is
stable compared to most recent study. The visualized great vessels
appear normal. There are foci of aortic atherosclerosis. No
pericardial effusion or pericardial thickening evident. There is no
demonstrable pulmonary embolus. Currently the maximum measured
diameter of the main pulmonary outflow tract is 2.9 cm. Note that
there is a stable small ductus diverticulum.

Mediastinum/Nodes: Thyroid appears unremarkable. There is no
appreciable thoracic adenopathy by size criteria. Stable
subcentimeter lymph nodes. No esophageal lesions are evident.

Lungs/Pleura: There are areas of slight atelectatic change. There is
a slight degree of lower lobe bronchiectatic change. There is no
appreciable edema or consolidation. No pleural effusion or pleural
thickening. Incidental note is made of a tiny granuloma in the right
upper lobe anteriorly on axial slice 39 series 6.

Upper Abdomen: There is a degree of hepatic steatosis. Visualized
upper abdominal structures otherwise appear unremarkable.

Musculoskeletal: There are no blastic or lytic bone lesions. No
chest wall lesions evident.

Review of the MIP images confirms the above findings.
IMPRESSION: 1. Stable appearance of the thoracic aorta. Ascending thoracic
aortic diameter is stable at 5.2 x 5.0 cm. No thoracic aortic
dissection. There is aortic atherosclerosis. No pulmonary embolus
evident. Ascending thoracic aortic aneurysm. Recommend semi-annual
imaging followup by CTA or MRA and referral to cardiothoracic
surgery if not already obtained. This recommendation follows 4717
ACCF/AHA/AATS/ACR/ASA/SCA/JEAMPIERE/JIBRIL/ANDREA MARIA/ERXLEBEN Guidelines for the
Diagnosis and Management of Patients With Thoracic Aortic Disease.
Circulation. 4717; 121: E266-e369. Aortic aneurysm NOS
(YDDNP-0NK.7).

2. No adenopathy by size criteria. Subcentimeter lymph nodes are
stable.

3. No edema or consolidation. Small granuloma right upper lobe near
the apex. Mild lower lobe bronchiectatic change bilaterally noted.

4.  Hepatic steatosis.

Aortic Atherosclerosis (YDDNP-WZ9.9).

Aortic Atherosclerosis (YDDNP-WZ9.9).

## 2020-08-19 ENCOUNTER — Other Ambulatory Visit: Payer: Self-pay

## 2020-08-19 ENCOUNTER — Ambulatory Visit: Payer: Self-pay | Admitting: Nurse Practitioner

## 2020-08-19 VITALS — BP 138/80 | HR 75 | Temp 97.3°F | Resp 16

## 2020-08-19 DIAGNOSIS — Z20822 Contact with and (suspected) exposure to covid-19: Secondary | ICD-10-CM

## 2020-08-19 LAB — POC COVID19 BINAXNOW: SARS Coronavirus 2 Ag: NEGATIVE

## 2020-08-19 NOTE — Progress Notes (Signed)
   Subjective:    Patient ID: Billy Herman, male    DOB: 06-Jun-1965, 55 y.o.   MRN: [id number]  HPI 55 year old male presenting to [facility] clinic after COVID exposure at work.   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   He was most recently working with someone unmasked 4 days ago.   Patient remain without symptoms  Did have COVID Fall 2020 and was hospitalized  Has had J&J and one moderna booster  Today's Vitals   08/19/20 1028  BP: 138/80  Pulse: 75  Resp: 16  Temp: (!) 97.3 F (36.3 C)  TempSrc: Tympanic  SpO2: 97%   There is no height or weight on file to calculate BMI.        Objective:   Physical Exam Constitutional:      Appearance: Normal appearance.  Neurological:     General: No focal deficit present.     Mental Status: He is alert.  Psychiatric:        Mood and Affect: Mood normal.    Recent Results (from the past 2160 hour(s))  POC COVID-19     Status: Normal   Collection Time: 08/19/20 10:25 AM  Result Value Ref Range   SARS Coronavirus 2 Ag Negative Negative         Assessment & Plan:   Will send out PCR for confirmation.   If positive today would be day 1 and he may return to work on Wednesday 08/24/20 without symptoms wearing a mask for 5 additional days  If negative recommended retesting only if he develops symptoms   RTC as needed will follow up with lab results when available.

## 2020-08-21 LAB — SARS-COV-2, NAA 2 DAY TAT

## 2020-08-21 LAB — NOVEL CORONAVIRUS, NAA: SARS-CoV-2, NAA: NOT DETECTED

## 2020-08-23 NOTE — Progress Notes (Signed)
I just did it.  Thanks.

## 2020-11-10 ENCOUNTER — Encounter: Payer: Self-pay | Admitting: *Deleted

## 2020-11-11 ENCOUNTER — Ambulatory Visit: Payer: BC Managed Care – PPO | Admitting: Registered Nurse

## 2020-11-11 ENCOUNTER — Encounter
Admission: RE | Disposition: A | Discharge status: Home / Self Care | Payer: Self-pay | Source: Home / Self Care | Attending: Gastroenterology

## 2020-11-11 ENCOUNTER — Other Ambulatory Visit: Payer: Self-pay

## 2020-11-11 ENCOUNTER — Encounter: Payer: Self-pay | Admitting: *Deleted

## 2020-11-11 ENCOUNTER — Ambulatory Visit
Admission: RE | Admit: 2020-11-11 | Discharge: 2020-11-11 | Disposition: A | Discharge status: Home / Self Care | Payer: BC Managed Care – PPO | Attending: Gastroenterology | Admitting: Gastroenterology

## 2020-11-11 DIAGNOSIS — K5730 Diverticulosis of large intestine without perforation or abscess without bleeding: Secondary | ICD-10-CM | POA: Insufficient documentation

## 2020-11-11 DIAGNOSIS — Z87891 Personal history of nicotine dependence: Secondary | ICD-10-CM | POA: Insufficient documentation

## 2020-11-11 DIAGNOSIS — Z1211 Encounter for screening for malignant neoplasm of colon: Secondary | ICD-10-CM | POA: Insufficient documentation

## 2020-11-11 DIAGNOSIS — Z86010 Personal history of colonic polyps: Secondary | ICD-10-CM | POA: Diagnosis not present

## 2020-11-11 DIAGNOSIS — Z7982 Long term (current) use of aspirin: Secondary | ICD-10-CM | POA: Insufficient documentation

## 2020-11-11 DIAGNOSIS — Z79899 Other long term (current) drug therapy: Secondary | ICD-10-CM | POA: Insufficient documentation

## 2020-11-11 DIAGNOSIS — K640 First degree hemorrhoids: Secondary | ICD-10-CM | POA: Insufficient documentation

## 2020-11-11 DIAGNOSIS — I719 Aortic aneurysm of unspecified site, without rupture: Secondary | ICD-10-CM | POA: Diagnosis not present

## 2020-11-11 DIAGNOSIS — Z8616 Personal history of COVID-19: Secondary | ICD-10-CM | POA: Insufficient documentation

## 2020-11-11 HISTORY — DX: Peripheral vascular disease, unspecified: I73.9

## 2020-11-11 HISTORY — PX: COLONOSCOPY WITH PROPOFOL: SHX5780

## 2020-11-11 HISTORY — DX: Benign neoplasm of colon, unspecified: D12.6

## 2020-11-11 SURGERY — COLONOSCOPY WITH PROPOFOL
Anesthesia: General

## 2020-11-11 MED ORDER — DEXMEDETOMIDINE HCL IN NACL 200 MCG/50ML IV SOLN
INTRAVENOUS | Status: DC | PRN
  Administered 2020-11-11: 20 ug via INTRAVENOUS

## 2020-11-11 MED ORDER — PROPOFOL 10 MG/ML IV BOLUS
INTRAVENOUS | Status: DC | PRN
  Administered 2020-11-11: 100 mg via INTRAVENOUS

## 2020-11-11 MED ORDER — SODIUM CHLORIDE 0.9 % IV SOLN: INTRAVENOUS | Status: DC

## 2020-11-11 MED ORDER — PROPOFOL 500 MG/50ML IV EMUL
INTRAVENOUS | Status: DC | PRN
  Administered 2020-11-11: 150 ug/kg/min via INTRAVENOUS

## 2020-11-11 MED ORDER — EPHEDRINE 5 MG/ML INJ
INTRAVENOUS | Status: AC
  Filled 2020-11-11: qty 5

## 2020-11-11 MED ORDER — LIDOCAINE HCL (CARDIAC) PF 100 MG/5ML IV SOSY
PREFILLED_SYRINGE | INTRAVENOUS | Status: DC | PRN
  Administered 2020-11-11: 100 mg via INTRAVENOUS

## 2020-11-11 NOTE — H&P (Signed)
Outpatient short stay form Pre-procedure 11/11/2020  Lesly Rubenstein, MD  Primary Physician: [name], MD  Reason for visit:  Surveillance colonoscopy  History of present illness:   55 y/o gentleman with history of aorta aneurysm repair and aortic valve repair here for surveillance colonoscopy for history of >3 Ta's on last colonoscopy done in 2017. No blood thinners. History of hernia repair. No family history of GI malignancies.    Current Facility-Administered Medications:    0.9 %  sodium chloride infusion, , Intravenous, Continuous, Early Steel, Hilton Cork, MD  Medications Prior to Admission  Medication Sig Dispense Refill Last Dose   aspirin EC 81 MG tablet Take 81 mg by mouth every evening.    11/10/2020 at 800   cyanocobalamin 1000 MCG tablet Take 1,000 mcg by mouth daily.   11/10/2020 at 0800   dexamethasone (DECADRON) 2 MG tablet Take 3 tablets once daily for 3 days, then 2 tablets once daily for 3 days, then 1 tablet once daily for 3 days, then STOP. 18 tablet 0 11/10/2020 at 0800   doxazosin (CARDURA) 2 MG tablet Take 2 mg by mouth daily.   11/10/2020 at 0800   ergocalciferol (VITAMIN D2) 50000 units capsule Take 50,000 Units by mouth once a week. Fridays   11/10/2020 at 0800   fenofibrate micronized (LOFIBRA) 134 MG capsule Take 134 mg by mouth every evening.    11/10/2020 at 0800   finasteride (PROSCAR) 5 MG tablet Take 5 mg by mouth daily.   11/10/2020 at 0800   iron polysaccharides (NIFEREX) 150 MG capsule Take 150 mg by mouth daily.      losartan-hydrochlorothiazide (HYZAAR) 100-12.5 MG tablet Take 1 tablet by mouth daily.   11/10/2020 at 0800   rosuvastatin (CRESTOR) 10 MG tablet Take 1 tablet (10 mg total) by mouth every evening. RESUME AFTER 2 WEEKS  3 11/10/2020 at 0800   vitamin C (ASCORBIC ACID) 500 MG tablet Take 500 mg by mouth daily.   11/10/2020 at 0800   famotidine (PEPCID) 20 MG tablet Take 1 tablet (20 mg total) by mouth daily for 10 days. 10 tablet 0    ferrous sulfate  325 (65 FE) MG tablet Take 325 mg by mouth every evening.  (Patient not taking: Reported on 11/11/2020)   Completed Course   metoprolol succinate (TOPROL-XL) 25 MG 24 hr tablet Take 25 mg by mouth every evening.        No Known Allergies   Past Medical History:  Diagnosis Date   COVID-19 12/10/2018   Diverticulosis 12/01/2015   Hyperlipidemia    Hypertension    Low serum vitamin D    PVD (peripheral vascular disease) ([place])    Sleep apnea    Tubular adenoma of colon     Review of systems:  Otherwise negative.    Physical Exam  Gen: Alert, oriented. Appears stated age.  HEENT: PERRLA. Lungs: No respiratory distress CV: RRR Abd: soft, benign, no masses Ext: No edema    Planned procedures: Proceed with colonoscopy. The patient understands the nature of the planned procedure, indications, risks, alternatives and potential complications including but not limited to bleeding, infection, perforation, damage to internal organs and possible oversedation/side effects from anesthesia. The patient agrees and gives consent to proceed.  Please refer to procedure notes for findings, recommendations and patient disposition/instructions.     Lesly Rubenstein, MD [facility]

## 2020-11-11 NOTE — Transfer of Care (Signed)
Immediate Anesthesia Transfer of Care Note  Patient: Billy Herman  Procedure(s) Performed: COLONOSCOPY WITH PROPOFOL  Patient Location: Endoscopy Unit  Anesthesia Type:General  Level of Consciousness: drowsy  Airway & Oxygen Therapy: Patient Spontanous Breathing  Post-op Assessment: Report given to RN and Post -op Vital signs reviewed and stable  Post vital signs: Reviewed and stable  Last Vitals:  Vitals Value Taken Time  BP 110/58 11/11/20 1129  Temp    Pulse 74 11/11/20 1129  Resp 22 11/11/20 1129  SpO2 94 % 11/11/20 1129  Vitals shown include unvalidated device data.  Last Pain:  Vitals:   11/11/20 1032  TempSrc: Temporal  PainSc: 0-No pain         Complications: No notable events documented.

## 2020-11-11 NOTE — Interval H&P Note (Signed)
History and Physical Interval Note:  11/11/2020 11:00 AM  Billy Herman  has presented today for surgery, with the diagnosis of HX ADEN COLON POLYPS.  The various methods of treatment have been discussed with the patient and family. After consideration of risks, benefits and other options for treatment, the patient has consented to  Procedure(s) with comments: COLONOSCOPY WITH PROPOFOL (N/A) - HX AORTIC VALVE REPAIR as a surgical intervention.  The patient's history has been reviewed, patient examined, no change in status, stable for surgery.  I have reviewed the patient's chart and labs.  Questions were answered to the patient's satisfaction.     Lesly Rubenstein  Ok to proceed with colonoscopy

## 2020-11-11 NOTE — Op Note (Signed)
[facility] Patient Name: Billy Herman Procedure Date: 11/11/2020 10:52 AM MRN: [id number] Account #: [id number] Date of Birth: 1966/01/08 Admit Type: Outpatient Age: 55 Room: [facility] ENDO ROOM 3 Gender: Male Note Status: Finalized Instrument Name: [name] [id number] Procedure:             Colonoscopy Indications:           Surveillance: Personal history of adenomatous polyps                         on last colonoscopy > 5 years ago, High risk colon                         cancer surveillance: Personal history of multiple (3                         or more) adenomas Providers:             Andrey Farmer MD, MD Medicines:             Monitored Anesthesia Care Complications:         No immediate complications. Procedure:             Pre-Anesthesia Assessment:                        - Prior to the procedure, a History and Physical was                         performed, and patient medications and allergies were                         reviewed. The patient is competent. The risks and                         benefits of the procedure and the sedation options and                         risks were discussed with the patient. All questions                         were answered and informed consent was obtained.                         Patient identification and proposed procedure were                         verified by the physician, the nurse, the anesthetist                         and the technician in the endoscopy suite. Mental                         Status Examination: alert and oriented. Airway                         Examination: normal oropharyngeal airway and neck                         mobility. Respiratory Examination: clear to  auscultation. CV Examination: normal. Prophylactic                         Antibiotics: The patient does not require prophylactic                         antibiotics. Prior Anticoagulants:  The patient has                         taken no previous anticoagulant or antiplatelet agents                         except for aspirin. ASA Grade Assessment: II - A                         patient with mild systemic disease. After reviewing                         the risks and benefits, the patient was deemed in                         satisfactory condition to undergo the procedure. The                         anesthesia plan was to use monitored anesthesia care                         (MAC). Immediately prior to administration of                         medications, the patient was re-assessed for adequacy                         to receive sedatives. The heart rate, respiratory                         rate, oxygen saturations, blood pressure, adequacy of                         pulmonary ventilation, and response to care were                         monitored throughout the procedure. The physical                         status of the patient was re-assessed after the                         procedure.                        After obtaining informed consent, the colonoscope was                         passed under direct vision. Throughout the procedure,                         the patient's blood pressure, pulse, and oxygen  saturations were monitored continuously. The                         Colonoscope was introduced through the anus and                         advanced to the the cecum, identified by appendiceal                         orifice and ileocecal valve. The colonoscopy was                         performed without difficulty. The patient tolerated                         the procedure well. The quality of the bowel                         preparation was good. Findings:      The perianal and digital rectal examinations were normal.      Scattered small-mouthed diverticula were found in the sigmoid colon,       descending colon, transverse colon  and ascending colon.      Internal hemorrhoids were found during retroflexion. The hemorrhoids       were Grade I (internal hemorrhoids that do not prolapse).      The exam was otherwise without abnormality on direct and retroflexion       views. Impression:            - Diverticulosis in the sigmoid colon, in the                         descending colon, in the transverse colon and in the                         ascending colon.                        - Internal hemorrhoids.                        - The examination was otherwise normal on direct and                         retroflexion views.                        - No specimens collected. Recommendation:        - Discharge patient to home.                        - Resume previous diet.                        - Continue present medications.                        - Await pathology results.                        - Repeat colonoscopy in 5 years for surveillance.                        -  Return to referring physician as previously                         scheduled. Procedure Code(s):     --- Professional ---                        C9470, Colorectal cancer screening; colonoscopy on                         individual at high risk Diagnosis Code(s):     --- Professional ---                        K64.0, First degree hemorrhoids                        Z86.010, Personal history of colonic polyps                        K57.30, Diverticulosis of large intestine without                         perforation or abscess without bleeding CPT copyright 2019 [employer]. All rights reserved. The codes documented in this report are preliminary and upon coder review may  be revised to meet current compliance requirements. Andrey Farmer MD, MD 11/11/2020 11:25:48 AM Number of Addenda: 0 Note Initiated On: 11/11/2020 10:52 AM Scope Withdrawal Time: 0 hours 9 minutes 28 seconds  Total Procedure Duration: 0 hours 13 minutes 31 seconds   Estimated Blood Loss:  Estimated blood loss: none.      [facility]

## 2020-11-11 NOTE — Anesthesia Postprocedure Evaluation (Signed)
Anesthesia Post Note  Patient: Billy Herman  Procedure(s) Performed: COLONOSCOPY WITH PROPOFOL  Patient location during evaluation: PACU Anesthesia Type: General Level of consciousness: awake and alert Pain management: pain level controlled Vital Signs Assessment: post-procedure vital signs reviewed and stable Respiratory status: spontaneous breathing, nonlabored ventilation, respiratory function stable and patient connected to nasal cannula oxygen Cardiovascular status: blood pressure returned to baseline and stable Postop Assessment: no apparent nausea or vomiting Anesthetic complications: no   No notable events documented.   Last Vitals:  Vitals:   11/11/20 1032 11/11/20 1127  BP: 118/89 (!) 110/58  Pulse: 77 66  Resp: 20 17  Temp: (!) 36.3 C (!) 36.4 C  SpO2: 98% 94%    Last Pain:  Vitals:   11/11/20 1137  TempSrc:   PainSc: 0-No pain                 Molli Barrows

## 2020-11-11 NOTE — Anesthesia Preprocedure Evaluation (Signed)
Anesthesia Evaluation  Patient identified by MRN, date of birth, ID band Patient awake    Reviewed: Allergy & Precautions, NPO status , Patient's Chart, lab work & pertinent test results  History of Anesthesia Complications Negative for: history of anesthetic complications  Airway Mallampati: III  TM Distance: <3 FB Neck ROM: full    Dental  (+) Chipped   Pulmonary neg shortness of breath, sleep apnea , former smoker,    Pulmonary exam normal        Cardiovascular Exercise Tolerance: Good hypertension, + Peripheral Vascular Disease  Normal cardiovascular exam     Neuro/Psych negative neurological ROS  negative psych ROS   GI/Hepatic negative GI ROS, Neg liver ROS, neg GERD  ,  Endo/Other  negative endocrine ROS  Renal/GU negative Renal ROS  negative genitourinary   Musculoskeletal   Abdominal   Peds  Hematology negative hematology ROS (+)   Anesthesia Other Findings Past Medical History: 12/10/2018: COVID-19 12/01/2015: Diverticulosis No date: Hyperlipidemia No date: Hypertension No date: Low serum vitamin D No date: PVD (peripheral vascular disease) (HCC) No date: Sleep apnea No date: Tubular adenoma of colon  Past Surgical History: 05/21/2019: AORTIC VALVULOPLASTY No date: APPENDECTOMY 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 12/01/2015: COLONOSCOPY WITH PROPOFOL; N/A     Comment:  Procedure: COLONOSCOPY WITH PROPOFOL;  Surgeon: Lollie Sails, MD;  Location: [facility] ENDOSCOPY;  Service:               Endoscopy;  Laterality: N/A; No date: HERNIA REPAIR No date: inguial hernia ; Bilateral 09/25/2018: LEFT HEART CATH AND CORONARY ANGIOGRAPHY; Left     Comment:  Procedure: LEFT HEART CATH AND CORONARY ANGIOGRAPHY;                Surgeon: Corey Skains, MD;  Location: [place]               CV LAB;  Service: Cardiovascular;  Laterality: Left;  BMI    Body Mass Index: 35.36 kg/m       Reproductive/Obstetrics negative OB ROS                             Anesthesia Physical Anesthesia Plan  ASA: 3  Anesthesia Plan: General   Post-op Pain Management:    Induction: Intravenous  PONV Risk Score and Plan: Propofol infusion and TIVA  Airway Management Planned: Natural Airway and Nasal Cannula  Additional Equipment:   Intra-op Plan:   Post-operative Plan:   Informed Consent: I have reviewed the patients History and Physical, chart, labs and discussed the procedure including the risks, benefits and alternatives for the proposed anesthesia with the patient or authorized representative who has indicated his/her understanding and acceptance.     Dental Advisory Given  Plan Discussed with: Anesthesiologist, CRNA and Surgeon  Anesthesia Plan Comments: (Patient consented for risks of anesthesia including but not limited to:  - adverse reactions to medications - risk of airway placement if required - damage to eyes, teeth, lips or other oral mucosa - nerve damage due to positioning  - sore throat or hoarseness - Damage to heart, brain, nerves, lungs, other parts of body or loss of life  Patient voiced understanding.)        Anesthesia Quick Evaluation

## 2021-02-24 IMAGING — DX DG CHEST 1V PORT
2 series · 2 of 2 positions shown · non-contrast
Comparison: CT 05/19/2018

CLINICAL DATA: Pt states he has been dealing with covid for 2 weeks
now, increasing SHOB, headache and decreased appetite. Sent here by
Dr for further evaluation. NAD. Sats 86% on RA.

EXAM:
PORTABLE CHEST 1 VIEW

[chest ap (1 of 2)]
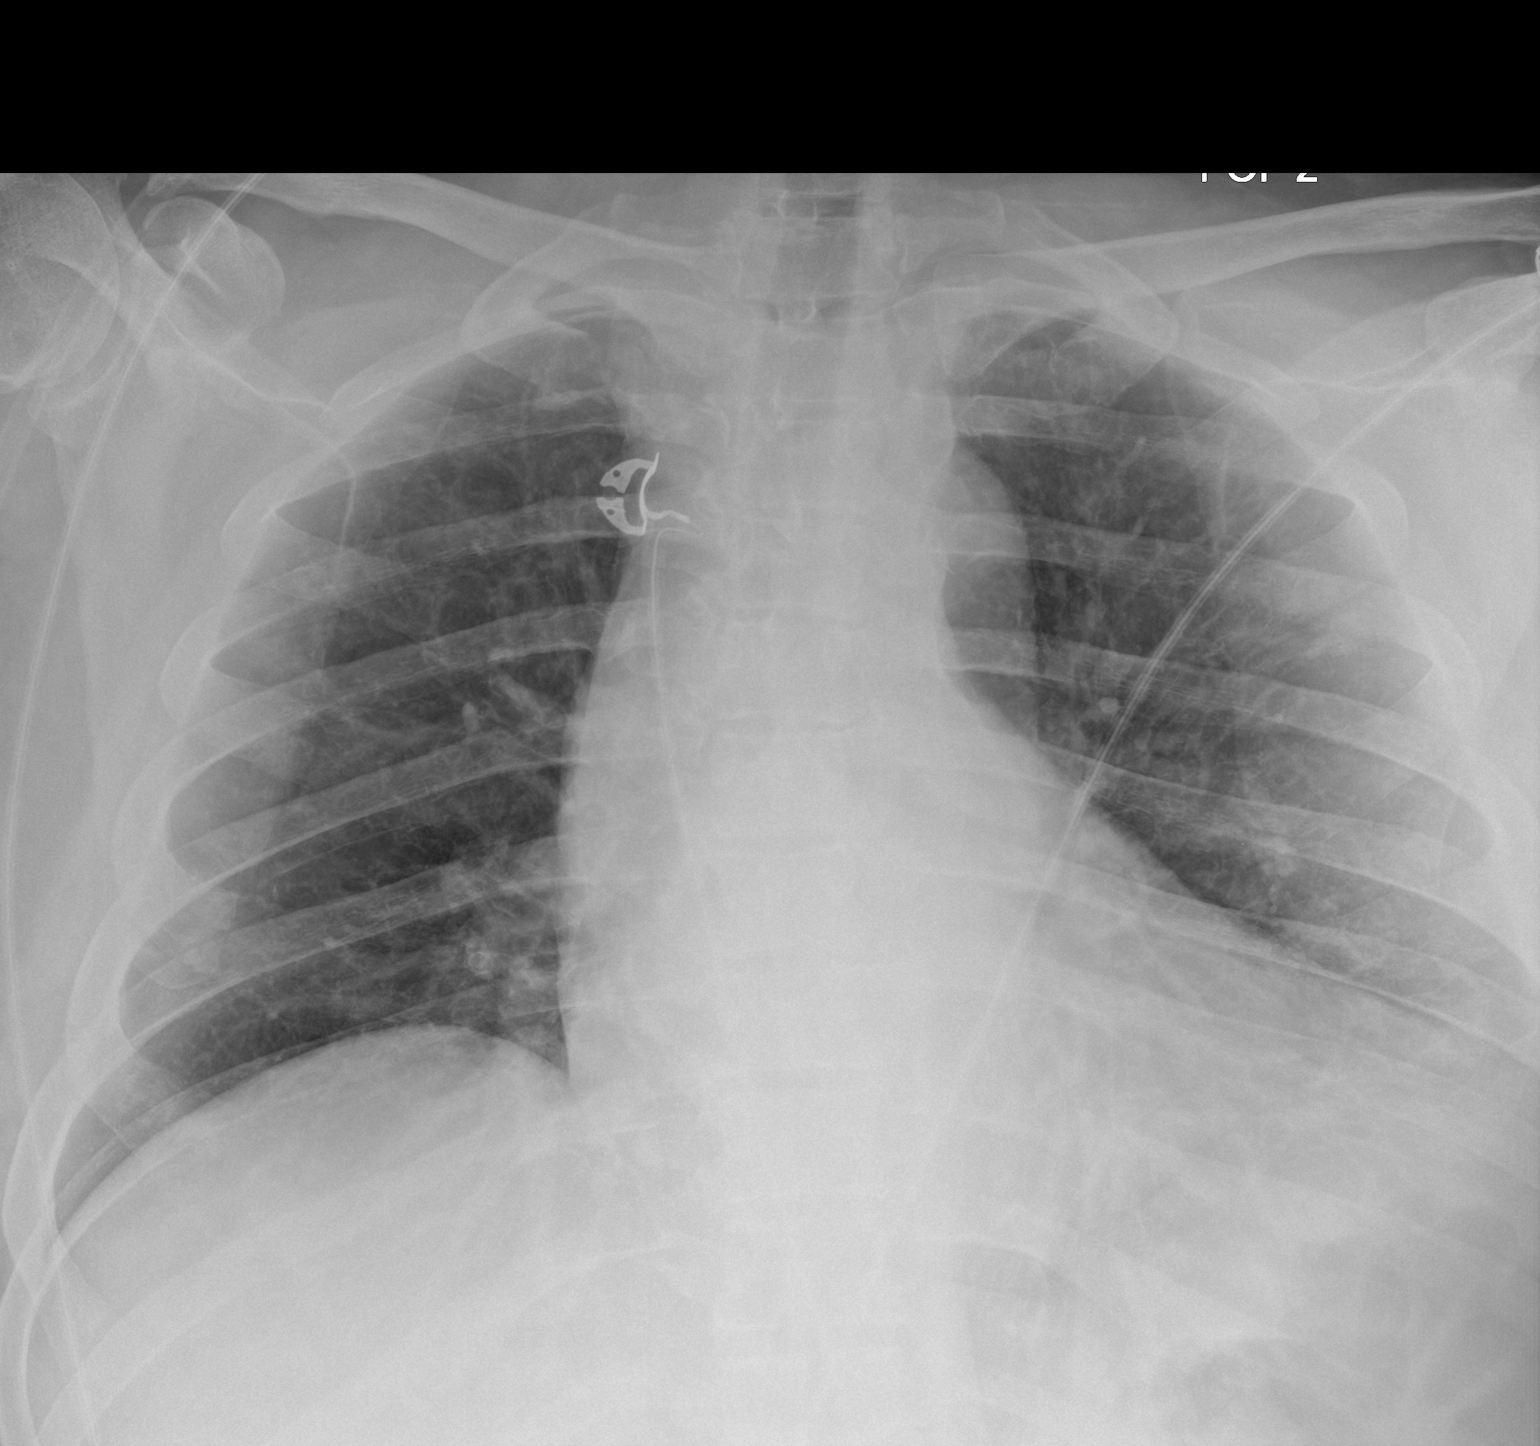

[chest ap (2 of 2)]
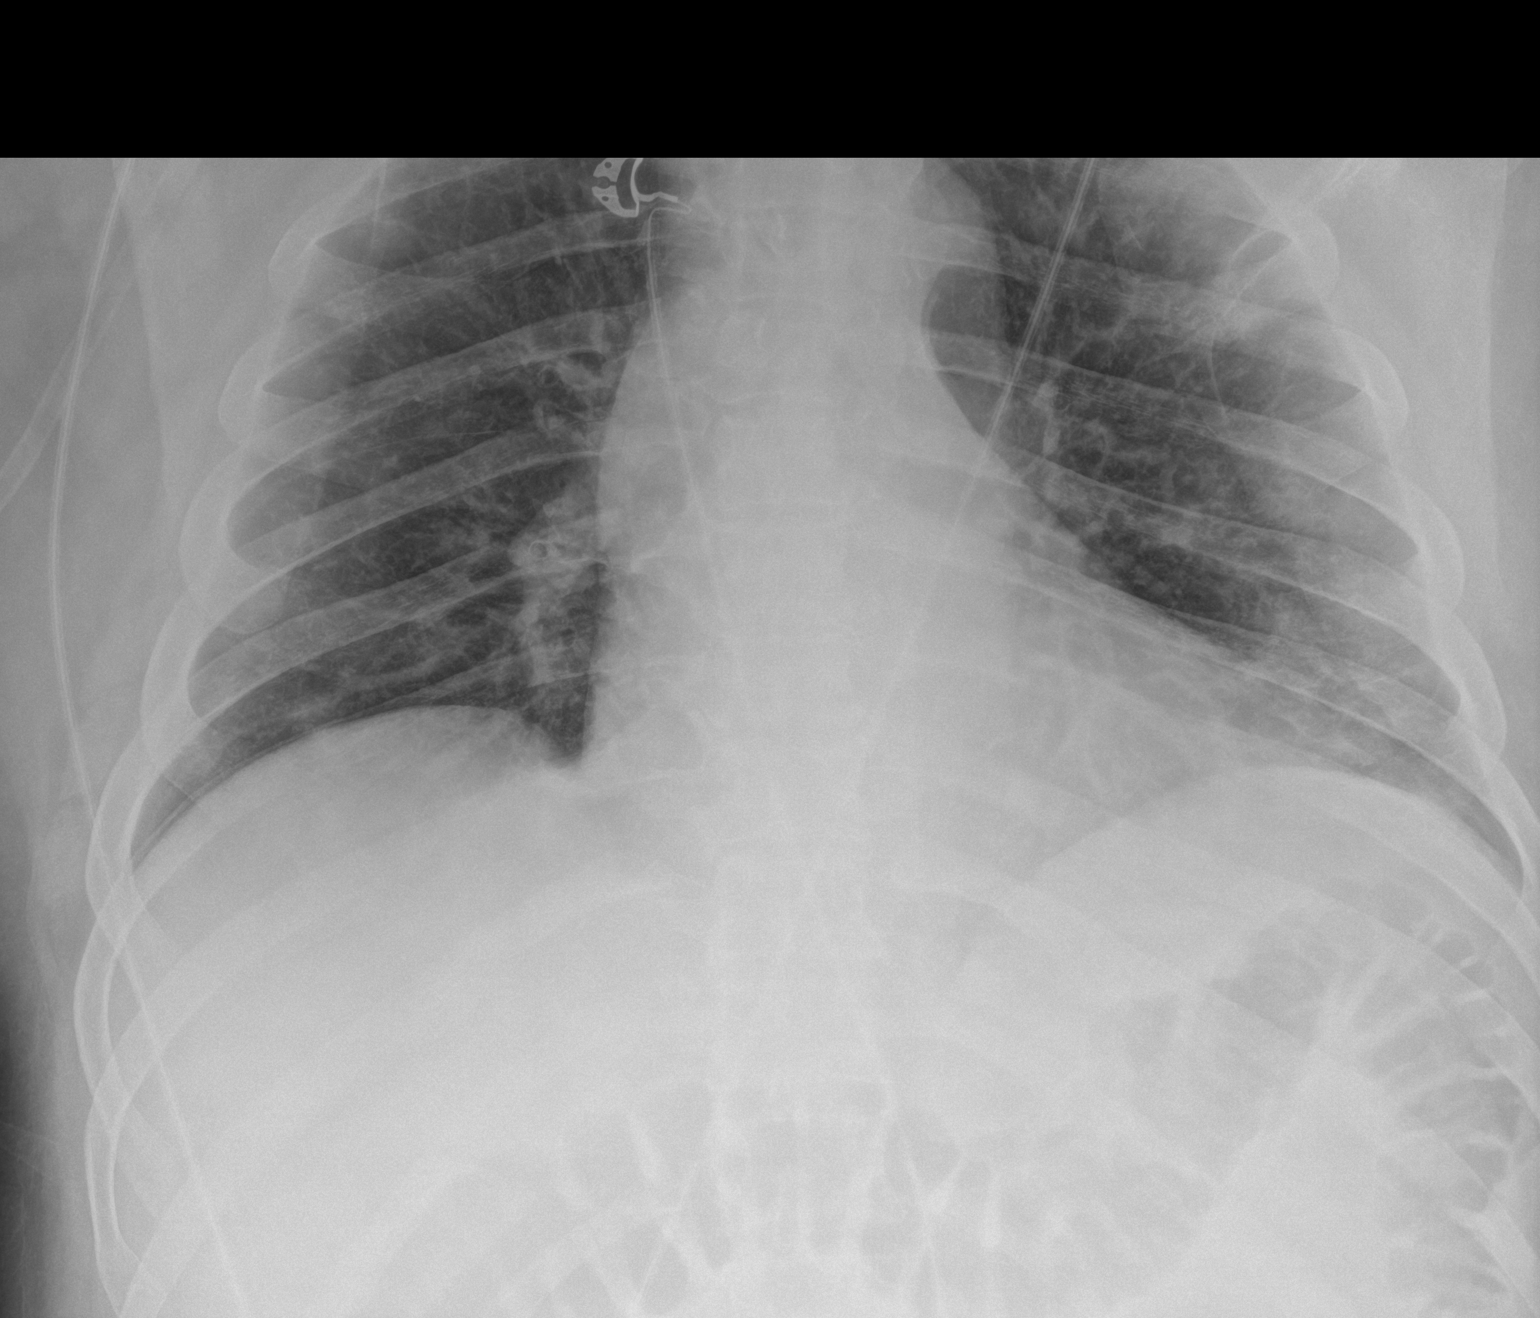

[2 of 2 positions shown; findings below may reference images not displayed]

FINDINGS: The heart size is normal. There are patchy infiltrates within the
lungs, predominantly involving the LATERAL portion of the LEFT lung
and LEFT lung base. Patchy somewhat opacities are identified in the
LATERAL RIGHT UPPER lobe. No pleural effusions. No evidence for
pulmonary edema. Visualized portion of the abdomen is unremarkable.
IMPRESSION: 1. Findings consistent with multifocal infection.
2. Follow-up is recommended to document clearing.
